# Patient Record
Sex: Male | Born: 1940 | Race: White | Hispanic: No | Marital: Married | Smoking: Never smoker
Health system: Southern US, Community
[De-identification: ages and names within clinical notes are randomized; demographics above are authoritative.]

## PROBLEM LIST (undated history)

## (undated) DIAGNOSIS — K439 Ventral hernia without obstruction or gangrene: Secondary | ICD-10-CM

## (undated) DIAGNOSIS — I10 Essential (primary) hypertension: Secondary | ICD-10-CM

## (undated) DIAGNOSIS — K56609 Unspecified intestinal obstruction, unspecified as to partial versus complete obstruction: Secondary | ICD-10-CM

## (undated) HISTORY — PX: HERNIA REPAIR: SHX51

## (undated) HISTORY — PX: TONSILLECTOMY: SUR1361

## (undated) HISTORY — PX: APPENDECTOMY: SHX54

---

## 2011-07-01 ENCOUNTER — Inpatient Hospital Stay: Payer: Self-pay | Admitting: Surgery

## 2011-07-01 ENCOUNTER — Ambulatory Visit: Payer: Self-pay | Admitting: Family Medicine

## 2011-07-01 LAB — COMPREHENSIVE METABOLIC PANEL
Albumin: 3.5 g/dL (ref 3.4–5.0)
Albumin: 3.5 g/dL (ref 3.4–5.0)
Alkaline Phosphatase: 52 U/L (ref 50–136)
Alkaline Phosphatase: 35 U/L — ABNORMAL LOW (ref 50–136)
Anion Gap: 8 (ref 7–16)
Anion Gap: 12 (ref 7–16)
BUN: 56 mg/dL — ABNORMAL HIGH (ref 7–18)
Calcium, Total: 10 mg/dL (ref 8.5–10.1)
Co2: 37 mmol/L — ABNORMAL HIGH (ref 21–32)
Co2: 32 mmol/L (ref 21–32)
EGFR (African American): 47 — ABNORMAL LOW
Glucose: 131 mg/dL — ABNORMAL HIGH (ref 65–99)
Osmolality: 297 (ref 275–301)
Osmolality: 293 (ref 275–301)
Potassium: 3.6 mmol/L (ref 3.5–5.1)
Potassium: 3.8 mmol/L (ref 3.5–5.1)
SGOT(AST): 38 U/L — ABNORMAL HIGH (ref 15–37)
SGOT(AST): 39 U/L — ABNORMAL HIGH (ref 15–37)
Total Protein: 7.1 g/dL (ref 6.4–8.2)

## 2011-07-01 LAB — CBC
HCT: 54 % — ABNORMAL HIGH (ref 40.0–52.0)
HGB: 18.1 g/dL — ABNORMAL HIGH (ref 13.0–18.0)
MCH: 31.1 pg (ref 26.0–34.0)
MCHC: 33.5 g/dL (ref 32.0–36.0)
MCV: 93 fL (ref 80–100)
RDW: 13.5 % (ref 11.5–14.5)

## 2011-07-01 LAB — CBC WITH DIFFERENTIAL/PLATELET
HCT: 55.8 % — ABNORMAL HIGH (ref 40.0–52.0)
HGB: 18.5 g/dL — ABNORMAL HIGH (ref 13.0–18.0)
MCH: 31.2 pg (ref 26.0–34.0)
MCV: 94 fL (ref 80–100)
Monocyte #: 0.8 x10 3/mm (ref 0.2–1.0)
Monocyte %: 11.4 %
Neutrophil %: 79.2 %
Platelet: 253 10*3/uL (ref 150–440)
RBC: 5.93 10*6/uL — ABNORMAL HIGH (ref 4.40–5.90)
RDW: 13.7 % (ref 11.5–14.5)

## 2011-07-01 LAB — URINALYSIS, COMPLETE
Bilirubin,UR: NEGATIVE
Blood: NEGATIVE
Leukocyte Esterase: NEGATIVE
Ph: 5 (ref 4.5–8.0)
Protein: 30
Squamous Epithelial: NONE SEEN
WBC UR: 4 /HPF (ref 0–5)

## 2011-07-01 LAB — APTT: Activated PTT: 30.3 secs (ref 23.6–35.9)

## 2011-07-01 LAB — PROTIME-INR
INR: 1.1
Prothrombin Time: 14.1 secs (ref 11.5–14.7)

## 2011-07-02 LAB — CBC WITH DIFFERENTIAL/PLATELET
Eosinophil #: 0 10*3/uL (ref 0.0–0.7)
Eosinophil %: 0.7 %
HCT: 47.8 % (ref 40.0–52.0)
HGB: 15.8 g/dL (ref 13.0–18.0)
MCHC: 33.1 g/dL (ref 32.0–36.0)
Monocyte #: 0.7 x10 3/mm (ref 0.2–1.0)
Monocyte %: 12.4 %
Neutrophil #: 4.2 10*3/uL (ref 1.4–6.5)
Neutrophil %: 74.8 %
Platelet: 194 10*3/uL (ref 150–440)
RBC: 5.14 10*6/uL (ref 4.40–5.90)
RDW: 13.7 % (ref 11.5–14.5)
WBC: 5.7 10*3/uL (ref 3.8–10.6)

## 2011-07-02 LAB — BASIC METABOLIC PANEL
Anion Gap: 8 (ref 7–16)
Chloride: 102 mmol/L (ref 98–107)
Creatinine: 1.06 mg/dL (ref 0.60–1.30)
EGFR (Non-African Amer.): >60
Glucose: 133 mg/dL — ABNORMAL HIGH (ref 65–99)

## 2011-07-03 LAB — CBC WITH DIFFERENTIAL/PLATELET
Basophil #: 0 10*3/uL (ref 0.0–0.1)
Basophil %: 0.4 %
Eosinophil %: 3.5 %
Lymphocyte %: 17.6 %
MCH: 31.1 pg (ref 26.0–34.0)
MCHC: 33.1 g/dL (ref 32.0–36.0)
MCV: 94 fL (ref 80–100)
Monocyte #: 0.8 x10 3/mm (ref 0.2–1.0)
Monocyte %: 15 %
Neutrophil %: 63.5 %
Platelet: 169 10*3/uL (ref 150–440)
RBC: 4.58 10*6/uL (ref 4.40–5.90)

## 2011-07-03 LAB — BASIC METABOLIC PANEL
BUN: 22 mg/dL — ABNORMAL HIGH (ref 7–18)
Chloride: 106 mmol/L (ref 98–107)
Creatinine: 0.91 mg/dL (ref 0.60–1.30)
EGFR (Non-African Amer.): >60
Glucose: 107 mg/dL — ABNORMAL HIGH (ref 65–99)
Osmolality: 287 (ref 275–301)
Potassium: 3.7 mmol/L (ref 3.5–5.1)
Sodium: 142 mmol/L (ref 136–145)

## 2014-05-28 NOTE — Consult Note (Signed)
PATIENT NAME:  Bryan Savage, Bryan Savage MR#:  [id number] DATE OF BIRTH:  06/30/1940  DATE OF CONSULTATION:  07/02/2011  REFERRING PHYSICIAN:  Prime Doc / Tiney Rougealph Ely, MD CONSULTING PHYSICIAN:  Kanchan Gal D. Juliann Paresallwood, MD  PRIMARY CARE PHYSICIAN: Clayborn BignessKatherine Bliss, MD  INDICATION: Tachycardia, PVCs, abnormal EKG.   HISTORY OF PRESENT ILLNESS: The patient is a 74 year old male with history of hypertension, gout, and cholelithiasis who presented with abdominal pain, nausea, vomiting, and preop for surgery. He has been having symptoms for several days, got dehydrated and had fluids given but symptoms got progressively worse. He has a history of a ventral and inguinal hernia years ago, but due to recurrent symptoms he was brought for evaluation. Ultrasound suggested cholelithiasis with sludge. Plain films are concerning for obstruction. He was seen in the emergency room for further evaluation. He had distended stomach with significant fluid with diffuse severe small bowel distention. He has had some improvement in his symptoms with bowel rest and NG tube, but may need surgery for cholelithiasis and obstruction.   REVIEW OF SYSTEMS: Denies blackout spells or syncope. He has had nausea and vomiting. No real fever. No chills. No sweats. Denies weight loss, weight gain. No hemoptysis or hematemesis. No bright red blood per rectum.   PAST MEDICAL HISTORY:  1. Hypertension. 2. Gout. 3. Cholelithiasis. 4. Dehydration.   PAST SURGICAL HISTORY:  1. Inguinal and ventral hernia repair.  2. Appendectomy.  3. Tonsillectomy.   ALLERGIES: No known drug allergies.    MEDICATIONS:  1. Promethazine 25 mg every 6 hours p.r.n. 2. Fluticasone nasal spray 50 mcg two sprays twice a day.  3. Colchicine 0.6 mg a day.  4. Atenolol 25 mg daily.  5. Aspirin 81 mg a day.  6. Multivitamin once a day.   FAMILY HISTORY: Arthritis, emphysema, and chronic obstructive pulmonary disease.   SOCIAL HISTORY: He lives in [place] with his  wife. He operates a [employer]. No smoking. No alcohol consumption.   PHYSICAL EXAMINATION:   VITAL SIGNS: Blood pressure 150/90, pulse 75, and respiratory rate 18.   HEENT: Normocephalic, atraumatic. Pupils equal and reactive to light.   NECK: Supple. No jugular venous distention, bruits, or adenopathy.   LUNGS: Clear to auscultation and percussion. No significant wheezing, rhonchi, or rales.   HEART: Regular rate and rhythm. Systolic ejection murmur at left sternal border.   ABDOMEN: Distended, positive bowel sounds, mild diffuse discomfort and tenderness. No rebound or guarding.   EXTREMITIES: Examination is within normal limits.   NEUROLOGIC: Examination is intact.   SKIN: Examination is normal.   LABS/STUDIES: Urinalysis was basically unremarkable.   White count 5.9, hemoglobin 18 hematocrit 54, and platelet count 235. BUN 56, creatinine 1.19, sodium 138, potassium 3.8, chloride 94, CO2 32. LFTs unremarkable.   Plain films, 3-way of the abdomen, with increased density in the left base compatible with atelectasis versus fibrosis with possible multiple air-fluid levels consistent with possible obstruction, gastroenteritis, enteritis.  Ultrasound of the abdomen suggests cholelithiasis with sludge.   EKG: Normal sinus rhythm, nonspecific ST-T wave changes, PVCs.   ASSESSMENT:  1. Preop for possible bowel obstruction.  2. Cholelithiasis.  3. Abnormal EKG with PVCs. 4. Dehydration. 5. Mild hypoxemia.  6. Gastroesophageal reflux disease. 7. History of gout.   PLAN: Agree with continue current medications. Agree with bowel rest. Agree with hydration. Agree with NG tube. Telemetry will be helpful. We will probably treat the patient medically. Echocardiogram will be helpful, but probably will not proceed with invasive  therapy and treat the patient conservatively for now. He has no cardiac history. I suspect medical therapy will be the way to go with conservative  therapy cardiac-wise until surgery is done.  ____________________________ Bobbie Stack. Juliann Pares, MD ddc:slb D: 07/03/2011 09:45:00 ET T: 07/03/2011 12:44:31 ET JOB#: [id number]  cc: Adelena Desantiago D. Juliann Pares, MD, <Dictator> Alwyn Pea MD ELECTRONICALLY SIGNED 07/15/2011 8:18

## 2014-05-28 NOTE — Discharge Summary (Signed)
PATIENT NAME:  Bryan Savage, Bryan Savage MR#:  [id number] DATE OF BIRTH:  03/31/1940  DATE OF ADMISSION:  07/01/2011 DATE OF DISCHARGE:  07/04/2011  BRIEF HISTORY/HOSPITAL COURSE: Bryan Savage is a 74 year old gentleman admitted through the Emergency Room with a large abdominal wall hernia and possible bowel obstruction. He has had multiple episodes of nausea and vomiting over the last several days prior to admission and presented to the Emergency Room with a massive abdominal wall hernia, CT evidence of possible small bowel obstruction. He has had a hernia for a number of years after a previous hernia repair. Recurrence created significant loss of domain in the abdomen with a good portion of his intestinal contents in the hernia sac. He was placed on nasogastric decompression, and his symptoms resolved almost immediately. His abdomen decompressed by the 30th, and we were able to discontinue his nasogastric tube and place him on liquid diet. He improved over the next 24 hours and was discharged home on the 31st to be followed in the office in 7 to 10 days' time. He will require an extensive hernia repair, and we have discussed [facility].   DISCHARGE MEDICATIONS: Vicodin for pain.   ____________________________ Carmie Endalph L. Ely III, MD rle:cbb D: 07/13/2011 07:17:14 ET T: 07/14/2011 13:28:10 ET JOB#: [id number]  cc: Quentin Orealph L. Ely III, MD, <Dictator> Quentin OreALPH L ELY MD ELECTRONICALLY SIGNED 07/15/2011 8:38

## 2014-05-28 NOTE — H&P (Signed)
PATIENT NAME:  Bryan Savage, Cashel O MR#:  [id number] DATE OF BIRTH:  November 09, 1940  DATE OF ADMISSION:  07/01/2011  PRIMARY CARE PHYSICIAN:  Dr. Quillian QuinceBliss ADMITTING PHYSICIAN:  Dr. Michela PitcherEly  CHIEF COMPLAINT: Abdominal pain, nausea, and vomiting.   BRIEF HISTORY: Bryan Savage is a 74 year old gentleman seen in the Emergency Room with a several-day history of abdominal pain. He began to have pronounced abdominal pain complicated by marked nausea and vomiting over the past 5 to 6 days. He was evaluated by his primary care doctor yesterday and diagnosed with a probable gastrointestinal virus. He was given 3 liters of IV fluid as he was mildly dehydrated and asked returned today for further evaluation. Today he was unimproved and there was concern about ongoing nausea. He was referred to the Urgent Care Center. Ultrasound was performed which revealed multiple gallstones without evidence of any gallbladder wall thickening. However, he was noted to have a massive abdominal wall hernia and was referred to the Emergency Room for further evaluation. CT scan was performed which revealed a partial small bowel obstruction likely resulting from a large segment of bowel in the hernia sac. The sac appeared to be massive with average size abdominal wall defect, but a large portion of his intestinal tract appeared to be in the sac. He reports having had a ventral hernia repair 25 years ago performed in [place]. He has not noted any problems recently but did develop a recurrent hernia several years ago. He had his last bowel movement five days ago. He has not been passing gas. He has not had a previous colonoscopy. He has had an appendectomy and two previous inguinal hernia repairs in addition to his ventral hernia repair. He denies any history of hepatitis, yellow jaundice, pancreatitis, peptic ulcer disease, gallbladder disease, or diverticulitis.   He has no cardiac history by note.  He does have a history of hypertension and is currently  on medication which he does not recall at the present time. He has no diabetes. He has no history of stroke or any cardiac symptoms, either dysrhythmia or pain. 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 He does not smoke cigarettes and is not a reformed smoker. He does not drink any alcohol. He lives with his wife.   REVIEW OF SYSTEMS: 10-point review of systems is unremarkable with the exception of some urinary symptoms from benign prostatic hypertrophy.   PHYSICAL EXAMINATION:  GENERAL: He is an alert although somewhat confused gentleman with conflicting answers to similar questions. His blood pressure is 140/88, heart rate is 94 and regular, oxygen saturations 95% on room air with a temperature of 97.6.   HEENT: Ruddy complexion, but no scleral icterus or pupillary abnormalities. There is no facial deformity.   NECK: Supple without adenopathy. Trachea is midline.   CHEST: Clear with no adventitious sounds or congestion. He appears to have normal pulmonary excursion.   CARDIAC: No gallop rhythms to my ear, but does appear to be in irregular cardiac rhythm. I am not sure whether he has simply multiple prematurities or he is actually in atrial fibrillation. EKG is pending.   ABDOMEN: His abdominal exam reveals a massive recurrent midline hernia with sac extending to the right lower quadrant. There are a visible loops of bowel through the abdominal wall. He does not have any significant abdominal tenderness. He has no rebound or guarding. He does have some hyperactive bowel sounds but no tinkling sounds are noted. He has well-healed groin scars. I do not feel  a groin hernia.   EXTREMITIES: Lower extremity exam was full range of motion, no deformities, and good distal pulses.   PSYCHIATRIC: Normal orientation although, again, he is a bit confused with current presentation. He has a normal affect.   IMPRESSION AND PLAN:  I have independently reviewed his laboratory values and his CT scan.  He does have a massive subcutaneous hernia sac with a moderate-size defect. There does appear to be a transition point in the sac itself as opposed to at the neck of the hernia. This finding would suggest that there are some adhesions responsible for his partial small bowel obstruction. We will plan to admit him to the hospital, begin nasogastric decompression, obtain an internal medicine risk assessment, and follow him over the next several days. He is likely going to need to consider elective repair. Because of  the massive amount of bowel outside his abdominal cavity he probably has lost the right of bowel domain at this point and will need to have some sort of abdominal wall closure assistance. I would anticipate he would either need some part of component separation or better yet staged abdominal wall closure with a Whitman patch. We will continue to evaluate him while he is in the hospital and help make a decision regarding the most appropriate intervention.     ____________________________ Quentin Ore III, MD rle:bjt D: 07/01/2011 22:15:28 ET T: 07/02/2011 09:42:35 ET JOB#: [id number]  cc: Carmie End, MD, <Dictator> Burley Saver, MD Quentin Ore MD ELECTRONICALLY SIGNED 07/02/2011 19:15

## 2014-05-28 NOTE — Consult Note (Signed)
PATIENT NAME:  Bryan Savage, Bryan Savage MR#:  [id number] DATE OF BIRTH:  05-Dec-1940  DATE OF CONSULTATION:  07/02/2011  REFERRING PHYSICIAN:  Dr. Michela Savage  CONSULTING PHYSICIAN:  Bryan Vanderloop, MD  PRIMARY CARE PHYSICIAN: Bryan Bigness, MD   REASON FOR CONSULTATION: Preop evaluation.   HISTORY OF PRESENT ILLNESS: Bryan Savage is a pleasant 74 year old gentleman with history of hypertension, gout, and cholelithiasis who presented to the hospital with complaints of abdominal pain, nausea, and vomiting. The patient reports that symptoms began approximately five days ago, began with abdominal pressure and then increased pain and swelling. By the following day he developed nausea and vomiting. Denies any hematemesis or bloody stool. Reports his last bowel movement was four days ago. The patient denies any history of similar symptoms. He reports a history of ventral and inguinal hernia repair one 40 years ago and another 35 years ago. The patient presented to his primary physician on 06/30/2011, was given IV fluids. The patient was given medication for symptom control but symptoms continued to progress. He represented and was sent to Urgent Care at which point an ultrasound was obtained along with three-view abdominal film. Ultrasound was revealing for cholelithiasis and gallbladder sludge. Plain film was concerning for possible obstruction. The patient was sent to the ED for further evaluation. CT scan of the abdomen was revealing for probable bilateral renal cysts. There was moderately to severely distended stomach with fluid. There was diffuse severe small bowel distention. There were two ventral hernias. The larger is a broad-based hernia at the umbilicus which contains a significant amount of mesentery and small and large bowel. They are both dilated and collapsed loops of small bowel in the hernia sac. Defined transition point not identified. There is a left paramedian ventral hernia above this hernia which  contains fat. Colonic diverticulosis is present. The patient denies any chest pain or shortness of breath. Reports that his symptoms have improved since admission. He is pretty active at baseline. 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   PAST MEDICAL HISTORY:  1. Hypertension.  2. Gout.  3. Cholelithiasis.    PAST SURGICAL HISTORY:  1. Inguinal and ventral hernia repair. One was 40 years ago and another one was 35 years ago.  2. Appendectomy.  3. Tonsillectomy.   ALLERGIES: No known drug allergies.   MEDICATIONS:  1. Promethazine 25 mg every 6 to 8 hours.  2. Fluticasone nasal spray 50 mcg two sprays nasal b.i.d.  3. Colchicine 0.6 mg as needed.  4. Atenolol 25 mg daily.  5. Aspirin 81 mg daily.  6. Multivitamin daily.   FAMILY HISTORY: Sister with arthritis. Mother with emphysema.   SOCIAL HISTORY: He lives in [place] with his wife. Denies any tobacco, alcohol, or drug use. They own a service station.   REVIEW OF SYSTEMS: CONSTITUTIONAL: No fevers. He endorsed nausea and vomiting as per history of present illness. EYES: No glaucoma or cataracts. ENT: No epistaxis, discharge, or dysphagia. RESPIRATORY: No cough, wheezing, shortness of breath, or hemoptysis. CARDIOVASCULAR: No chest pain, orthopnea, edema, palpitations, or syncope. GI: As per history of present illness. GU: No dysuria or hematuria. ENDOCRINE: No polyuria or polydipsia. HEME: No easy bleeding. SKIN: No ulcers. MUSCULOSKELETAL: No joint pain or swelling. NEUROLOGIC: No history of strokes or seizure. MUSCULOSKELETAL: No neck pain or joint pain. History of gout. PSYCH: He denies any suicidal ideation.   PHYSICAL EXAMINATION:   VITAL SIGNS: Temperature 98.2, pulse 76, respiratory rate 18, blood pressure 155/93, sating at  91% on room air.   GENERAL: Lying in bed in no apparent distress.   HEENT: Normocephalic, atraumatic. Pupils equal, symmetric, nonicteric NG tube in place. He has slightly dry mucous  membrane.   NECK: Soft and supple without adenopathy or JVP.   CARDIOVASCULAR: Non-tachy with ectopic beats. No murmurs, rubs, or gallops.   LUNGS: Faint basilar crackles. No use of accessory muscles or increased respiratory effort.   ABDOMEN: Soft. Minimal tenderness. He has a large hernia mainly to the right side. He has hypoactive bowel sounds.   EXTREMITIES: No edema. Dorsal pedis pulses intact.   MUSCULOSKELETAL: No joint effusion.   SKIN: No ulcers.   NEUROLOGIC: No dysarthria or aphasia. Symmetrical strength. No focal deficits.   PSYCH: He is alert and oriented. The patient is cooperative.   PERTINENT LABS AND STUDIES: Urinalysis with specific gravity of 1.025, pH 5, protein 30 mg/dL, RBC 2 per high-power field, WBC 4 per high-power field. WBC 5.9, hemoglobin 18.1, hematocrit 54, platelets 235, MCV 93, glucose 131, BUN 56, creatinine 1.19, sodium 138, potassium 3.8, chloride 94, carbon dioxide 32, calcium 10, total bilirubin 1.1, alkaline phosphatase 35, ALT 35, AST 39. INR 1.1. PTT 30.3.  Three-way abdomen with increased density at the left base compatible with atelectasis or fibrosis. There is possible soft tissue hernia extending laterally from the right pelvis. There are multiple nonspecific fluid levels and nondilated loops of bowel. Such fluid levels usually are associated with gastroenteritis, enteritis, or intra-abdominal inflammation. The patient has soft tissue hernia on the right. The possibility of low-grade partial obstruction cannot be totally excluded. CT evaluation was recommended. Also noted cholelithiasis.   Ultrasound of the abdomen with cholelithiasis. No associated thickening of the gallbladder wall. Sludge is present in the gallbladder. Pancreas is obscured by bowel gas. CT scan results as dictated above.   ASSESSMENT AND PLAN: Bryan Savage is a pleasant 74 year old gentleman with history of hypertension, gout, and cholelithiasis presenting with nausea, vomiting,  abdominal pain. 1. Preop evaluation. The patient falls into a low risk category for surgery. Discussed with patient and wife and they wish to pursue surgery if indicated. Will hold aspirin. Continue atenolol. On cardiac exam noted to have ectopic beats. Will place on off-unit tele and get EKG.  2. Dehydration as supported by renal insufficiency, hypercalcemia, hemoconcentration, elevated BUN which has improved posthydration.  3. Bowel obstruction. Management as per Surgery.  4. Mild hypoxia likely in the setting of atelectasis as noted on x-ray. Continue oxygen as needed and start on incentive spirometry.  5. Prophylaxis with Protonix, TEDs, and SCDs.   TIME SPENT: Approximately 50 minutes on patient care.   ____________________________ Reuel DerbyAlounthith Jamilyn Pigeon, MD ap:drc D: 07/02/2011 04:18:00 ET T: 07/02/2011 11:02:06 ET JOB#: [id number]  cc: Pearlean BrownieAlounthith Abdo Denault, MD, <Dictator> Reuel DerbyALOUNTHITH Jamaree Hosier MD ELECTRONICALLY SIGNED 07/10/2011 22:33

## 2015-08-07 ENCOUNTER — Emergency Department: Payer: Medicare HMO

## 2015-08-07 ENCOUNTER — Inpatient Hospital Stay
Admission: EM | Admit: 2015-08-07 | Discharge: 2015-08-09 | DRG: 394 | Disposition: A | Discharge status: Home / Self Care | Payer: Medicare HMO | Attending: Surgery | Admitting: Surgery

## 2015-08-07 ENCOUNTER — Encounter: Payer: Self-pay | Admitting: *Deleted

## 2015-08-07 DIAGNOSIS — K432 Incisional hernia without obstruction or gangrene: Principal | ICD-10-CM | POA: Diagnosis present

## 2015-08-07 DIAGNOSIS — K565 Intestinal adhesions [bands], unspecified as to partial versus complete obstruction: Secondary | ICD-10-CM

## 2015-08-07 DIAGNOSIS — K5669 Other intestinal obstruction: Secondary | ICD-10-CM | POA: Diagnosis not present

## 2015-08-07 DIAGNOSIS — Z9049 Acquired absence of other specified parts of digestive tract: Secondary | ICD-10-CM

## 2015-08-07 DIAGNOSIS — K5652 Intestinal adhesions [bands] with complete obstruction: Secondary | ICD-10-CM | POA: Diagnosis present

## 2015-08-07 DIAGNOSIS — Z8249 Family history of ischemic heart disease and other diseases of the circulatory system: Secondary | ICD-10-CM

## 2015-08-07 DIAGNOSIS — K56609 Unspecified intestinal obstruction, unspecified as to partial versus complete obstruction: Secondary | ICD-10-CM | POA: Insufficient documentation

## 2015-08-07 DIAGNOSIS — I10 Essential (primary) hypertension: Secondary | ICD-10-CM | POA: Diagnosis present

## 2015-08-07 DIAGNOSIS — E860 Dehydration: Secondary | ICD-10-CM | POA: Diagnosis present

## 2015-08-07 HISTORY — DX: Essential (primary) hypertension: I10

## 2015-08-07 HISTORY — DX: Unspecified intestinal obstruction, unspecified as to partial versus complete obstruction: K56.609

## 2015-08-07 LAB — CBC WITH DIFFERENTIAL/PLATELET
BASOS PCT: 0 %
Basophils Absolute: 0 10*3/uL (ref 0–0.1)
Eosinophils Absolute: 0 10*3/uL (ref 0–0.7)
Eosinophils Relative: 0 %
HEMATOCRIT: 56.5 % — AB (ref 40.0–52.0)
HEMOGLOBIN: 19.4 g/dL — AB (ref 13.0–18.0)
LYMPHS ABS: 0.9 10*3/uL — AB (ref 1.0–3.6)
Lymphocytes Relative: 10 %
MCH: 31.9 pg (ref 26.0–34.0)
MCHC: 34.4 g/dL (ref 32.0–36.0)
MCV: 92.8 fL (ref 80.0–100.0)
MONOS PCT: 8 %
Monocytes Absolute: 0.7 10*3/uL (ref 0.2–1.0)
NEUTROS ABS: 6.9 10*3/uL — AB (ref 1.4–6.5)
NEUTROS PCT: 82 %
Platelets: 196 10*3/uL (ref 150–440)
RBC: 6.09 MIL/uL — ABNORMAL HIGH (ref 4.40–5.90)
RDW: 13.5 % (ref 11.5–14.5)
WBC: 8.5 10*3/uL (ref 3.8–10.6)

## 2015-08-07 LAB — COMPREHENSIVE METABOLIC PANEL
ALK PHOS: 29 U/L — AB (ref 38–126)
ALT: 32 U/L (ref 17–63)
AST: 38 U/L (ref 15–41)
Albumin: 4.8 g/dL (ref 3.5–5.0)
Anion gap: 14 (ref 5–15)
BUN: 31 mg/dL — AB (ref 6–20)
CALCIUM: 11.3 mg/dL — AB (ref 8.9–10.3)
CO2: 32 mmol/L (ref 22–32)
CREATININE: 1.18 mg/dL (ref 0.61–1.24)
Chloride: 93 mmol/L — ABNORMAL LOW (ref 101–111)
GFR calc non Af Amer: 59 mL/min — ABNORMAL LOW (ref >60)
Glucose, Bld: 125 mg/dL — ABNORMAL HIGH (ref 65–99)
Potassium: 3.8 mmol/L (ref 3.5–5.1)
SODIUM: 139 mmol/L (ref 135–145)
Total Bilirubin: 1.9 mg/dL — ABNORMAL HIGH (ref 0.3–1.2)
Total Protein: 8.3 g/dL — ABNORMAL HIGH (ref 6.5–8.1)

## 2015-08-07 LAB — LIPASE, BLOOD: Lipase: 26 U/L (ref 11–51)

## 2015-08-07 LAB — MAGNESIUM: MAGNESIUM: 2.2 mg/dL (ref 1.7–2.4)

## 2015-08-07 LAB — LACTIC ACID, PLASMA: Lactic Acid, Venous: 2 mmol/L (ref 0.5–1.9)

## 2015-08-07 MED ORDER — DIATRIZOATE MEGLUMINE & SODIUM 66-10 % PO SOLN
15.0000 mL | Freq: Once | ORAL | Status: AC
  Administered 2015-08-07: 15 mL via ORAL

## 2015-08-07 MED ORDER — ONDANSETRON HCL 4 MG/2ML IJ SOLN
4.0000 mg | INTRAMUSCULAR | Status: AC
  Administered 2015-08-07: 4 mg via INTRAVENOUS
  Filled 2015-08-07: qty 2

## 2015-08-07 MED ORDER — SODIUM CHLORIDE 0.9 % IV BOLUS (SEPSIS)
500.0000 mL | INTRAVENOUS | Status: AC
  Administered 2015-08-07: 500 mL via INTRAVENOUS

## 2015-08-07 MED ORDER — MORPHINE SULFATE (PF) 4 MG/ML IV SOLN
4.0000 mg | Freq: Once | INTRAVENOUS | Status: AC
  Administered 2015-08-07: 4 mg via INTRAVENOUS
  Filled 2015-08-07: qty 1

## 2015-08-07 MED ORDER — IOPAMIDOL (ISOVUE-300) INJECTION 61%
100.0000 mL | Freq: Once | INTRAVENOUS | Status: AC | PRN
  Administered 2015-08-07: 100 mL via INTRAVENOUS

## 2015-08-07 NOTE — ED Provider Notes (Signed)
[facility] Emergency Department Provider Note  ____________________________________________  Time seen: Approximately 5:28 PM  I have reviewed the triage vital signs and the nursing notes.   HISTORY  Chief Complaint Constipation    HPI Bryan Savage is a 75 y.o. male with hx of ventral hernia repair many years ago and bowel obstruction about 4 years ago managed non-operatively presents with gradual onset worsening nausea, constipation, abdominal distention, and abdominal discomfort for about 2 days.  He states he normally is very regular with 2 bowel movements per day but has not had any bowel movement for 2 days.  His abdomen feels increasingly full and uncomfortable which he describes as a very mild ill ache.  He has had slightly decreased appetite.  He has been persistently nauseated which waxes and wanes from mild to severe but has not yet vomited.  He does occasionally pass gas.  He denies chest pain, shortness of breath, fever/chills, dysuria.   Past Medical History  Diagnosis Date  . Hypertension   . Bowel obstruction (HCC)     There are no active problems to display for this patient.   History reviewed. No pertinent past surgical history.  No current outpatient prescriptions on file.  Allergies Review of patient's allergies indicates no known allergies.  History reviewed. No pertinent family history.  Social History Social History  Substance Use Topics  . Smoking status: Never Smoker   . Smokeless tobacco: None  . Alcohol Use: None    Review of Systems Constitutional: No fever/chills Eyes: No visual changes. ENT: No sore throat. Cardiovascular: Denies chest pain. Respiratory: Denies shortness of breath. Gastrointestinal: Constipation, nausea, occasionally passing gas, no vomiting, distention Genitourinary: Negative for dysuria. Musculoskeletal: Negative for back pain. Skin: Negative for rash. Neurological: Negative for  headaches, focal weakness or numbness.  10-point ROS otherwise negative.  ____________________________________________   PHYSICAL EXAM:  VITAL SIGNS: ED Triage Vitals  Enc Vitals Group     BP 08/07/15 1621 148/108 mmHg     Pulse Rate 08/07/15 1621 79     Resp 08/07/15 1621 18     Temp 08/07/15 1621 97.6 F (36.4 C)     Temp Source 08/07/15 1621 Oral     SpO2 08/07/15 1621 90 %     Weight 08/07/15 1621 170 lb (77.111 kg)     Height 08/07/15 1621 5\' 7"  (1.702 m)     Head Cir --      Peak Flow --      Pain Score --      Pain Loc --      Pain Edu? --      Excl. in GC? --     Constitutional: Alert and oriented. Well appearing and in no acute distress. Eyes: Conjunctivae are normal. PERRL. EOMI. Head: Atraumatic. Nose: No congestion/rhinnorhea. Mouth/Throat: Mucous membranes are moist.  Oropharynx non-erythematous. Neck: No stridor.  No meningeal signs.   Cardiovascular: Normal rate, regular rhythm. Good peripheral circulation. Grossly normal heart sounds.   Respiratory: Normal respiratory effort.  No retractions. Lungs CTAB. Gastrointestinal: The patient has a protuberant abdomen which she says is normal although he claims it is more distended than usual.  He has an old visual hernia scar in his abdomen is soft and the protuberance (likely a severe hernia) is nontender and easily reducible.  Decreased bowel sounds throughout.  No tenderness to palpation throughout. Musculoskeletal: No lower extremity tenderness nor edema. No gross deformities of extremities. Neurologic:  Normal speech and language. No  gross focal neurologic deficits are appreciated.  Skin:  Skin is warm, dry and intact. No rash noted. Psychiatric: Mood and affect are normal. Speech and behavior are normal.  ____________________________________________   LABS (all labs ordered are listed, but only abnormal results are displayed)  Labs Reviewed  CBC WITH DIFFERENTIAL/PLATELET - Abnormal; Notable for the  following:    RBC 6.09 (*)    Hemoglobin 19.4 (*)    HCT 56.5 (*)    Neutro Abs 6.9 (*)    Lymphs Abs 0.9 (*)    All other components within normal limits  LACTIC ACID, PLASMA - Abnormal; Notable for the following:    Lactic Acid, Venous 2.0 (*)    All other components within normal limits  COMPREHENSIVE METABOLIC PANEL - Abnormal; Notable for the following:    Chloride 93 (*)    Glucose, Bld 125 (*)    BUN 31 (*)    Calcium 11.3 (*)    Total Protein 8.3 (*)    Alkaline Phosphatase 29 (*)    Total Bilirubin 1.9 (*)    GFR calc non Af Amer 59 (*)    All other components within normal limits  LIPASE, BLOOD  MAGNESIUM  LACTIC ACID, PLASMA   ____________________________________________  EKG  ED ECG REPORT I, Elita Dame, the attending physician, personally viewed and interpreted this ECG.   Date: 08/07/2015  EKG Time: 18:10  Rate: 101  Rhythm: atrial fibrillation, rate 101  Axis: Left axis deviation  Intervals:left anterior fascicular block  ST&T Change: Non-specific ST segment / T-wave changes, but no evidence of acute ischemia.   ____________________________________________  RADIOLOGY   Ct Abdomen Pelvis W Contrast  08/07/2015  CLINICAL DATA:  Extensive aortic atherosclerosis. EXAM: CT ABDOMEN AND PELVIS WITH CONTRAST TECHNIQUE: Multidetector CT imaging of the abdomen and pelvis was performed using the standard protocol following bolus administration of intravenous contrast. CONTRAST:  ISOVUE-300 IOPAMIDOL (ISOVUE-300) INJECTION 61% COMPARISON:  Jul 01, 2011 CT abdomen and pelvis; abdomen series August 07, 2015 FINDINGS: Lower chest: There is scarring in the anterior and lateral left base regions. Lung bases otherwise are clear. There are foci of coronary artery calcification present. There is dilatation of the distal esophagus, likely due to the bowel obstruction more distally. Hepatobiliary: There are multiple cysts scattered throughout the liver, stable. The largest  cyst is in the anterior segment of the right lobe of the liver near the dome measuring 2.2 x 2.0 cm. No noncystic liver masses are apparent. Gallbladder is mildly distended with several gallstones within the gallbladder. The gallbladder wall is not appreciably thickened. There is no biliary duct dilatation. Pancreas: Pancreas is somewhat atrophic. There is no pancreatic mass or inflammatory focus. Spleen: No splenic lesions are evident. Adrenals/Urinary Tract: Adrenals appear stable compared to prior study. There is slight left adrenal hypertrophy, stable. Right adrenal appears normal. There are several cysts in the left kidney. The largest cyst is in the periphery of the upper pole of the left kidney laterally measuring 3.0 x 2.5 cm. There are tiny cysts in the right kidney. There is no hydronephrosis on either side. There is no renal or ureteral calculus on either side. Urinary bladder is midline with wall thickness within normal limits. Stomach/Bowel: There is diffuse dilatation of the stomach and small bowel to the level of a transition zone in the mid to distal ileum. This transition zone is best appreciated on sagittal slice 124 series 6, located just proximal to bowel extending into a a sizable ventral  hernia. There are multiple loops of small and large bowel within this large hernia. There is no appreciable bowel wall or mesenteric thickening. No free air or portal venous air is evident. There is no bowel pneumatosis. There are multiple sigmoid diverticula but no diverticulitis evident. Vascular/Lymphatic: There is atherosclerotic calcification in the aorta. The aorta is somewhat ectatic. There is no abdominal aortic aneurysm. There is also calcification in common and internal iliac arteries bilaterally. Major mesenteric vessels appear patent. There is no adenopathy in the abdomen or pelvis. Reproductive: The prostate is prominent with multiple prostatic calculi. Seminal vesicles appear unremarkable. No  pelvic mass or pelvic fluid collection is evident. Other: The appendix resides within the ventral hernia. There is no periappendiceal region inflammation. The ventral hernia measures 10.8 cm from right to left and 4.8 cm from superior to inferior. As noted above, multiple loops of small and large bowel extending into this hernia, more toward the right lateral abdomen compared to the left. There is no ascites or abscess in the abdomen or pelvis. Musculoskeletal: There is degenerative change in the lumbar spine. 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 There are no blastic or lytic bone lesions. No intramuscular lesions are evident. IMPRESSION: Small bowel obstruction with transition zone in the mid to distal ileum. The transition zone is located near the inferior aspect of a large ventral hernia. 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 The cecum and appendix are located within the rightward aspect of this large ventral hernia. There are loops of dilated jejunum as well as nondilated distal ileum within this hernia. Sigmoid diverticulosis without diverticulitis. Gallbladder mildly distended with cholelithiasis. Gallbladder wall not thickened. Prostate is prominent contains multiple calculi. Advise correlation with PSA. No renal or ureteral calculi.  No hydronephrosis. Aortic atherosclerosis. Multiple foci of coronary artery calcification. No bowel pneumatosis.  No abscess.  No ascites. Electronically Signed   By: Bretta BangWilliam  Woodruff III M.D.   On: 08/07/2015 20:07   Dg Abd Acute W/chest  08/07/2015  CLINICAL DATA:  Nausea, vomiting and abdominal distension since yesterday. Previous right-sided abdominal wall hernia repair approximately 20 years ago. EXAM: DG ABDOMEN ACUTE W/ 1V CHEST COMPARISON:  Abdomen plain film dated 07/02/2011. FINDINGS: Single view of the chest: Heart size is normal. There is age-related aortic ectasia. Atherosclerotic changes noted at the aortic arch. There is slight  elevation of the left hemidiaphragm with overlying atelectasis and/or scarring. Lungs otherwise clear. Osseous structures about the chest are unremarkable. Supine and upright views of the abdomen: There are moderately distended gas-filled loops of bowel within the central abdomen and right abdomen, most likely small bowel, with associated air-fluid levels. Suspect right-sided abdominal wall hernia. No evidence of free intraperitoneal air appreciated. IMPRESSION: 1. Probable small bowel obstruction, possibly related to recurrent right abdominal wall hernia. Recommend abdomen/pelvis CT with oral and IV contrast for further characterization. 2. No evidence of acute cardiopulmonary abnormality. 3. Aortic atherosclerosis. Electronically Signed   By: Bary RichardStan  Maynard M.D.   On: 08/07/2015 18:09    ____________________________________________   PROCEDURES  Procedure(s) performed:   Procedures   ____________________________________________   INITIAL IMPRESSION / ASSESSMENT AND PLAN / ED COURSE  Pertinent labs & imaging results that were available during my care of the patient were reviewed by me and considered in my medical decision making (see chart for details).  The patient is borderline hypoxemic at triage and then had an oxygen saturation of 89% on room air.  He denies having any lung disease states  he is not feel short of breath.  His lungs are clear.  I will check an acute abdomen series to rule out acute surgical emergencies such as volvulus as well as to check a chest x-ray.  I anticipate moving forward with a CT scan of his abdomen.  He does not require pain medicine at this time.   ----------------------------------------- 8:35 PM on 08/07/2015 -----------------------------------------  The patient is now vomiting after his CT scan and is requesting pain and nausea medicine.  His CT scan is consistent with small bowel obstruction in the ileum.  The nurses will place an NG tube.  I  discussed the case by phone with Dr. Orvis BrillLoflin who evaluated the patient in person in the emergency department and admit for further management of his SBO. ____________________________________________  FINAL CLINICAL IMPRESSION(S) / ED DIAGNOSES  Final diagnoses:  Small bowel obstruction (HCC)     MEDICATIONS GIVEN DURING THIS VISIT:  Medications  sodium chloride 0.9 % bolus 500 mL (not administered)  morphine 4 MG/ML injection 4 mg (not administered)  ondansetron (ZOFRAN) injection 4 mg (not administered)  ondansetron (ZOFRAN) injection 4 mg (4 mg Intravenous Given 08/07/15 1824)  diatrizoate meglumine-sodium (GASTROGRAFIN) 66-10 % solution 15 mL (15 mLs Oral Given 08/07/15 1851)  iopamidol (ISOVUE-300) 61 % injection 100 mL (100 mLs Intravenous Contrast Given 08/07/15 1938)     NEW OUTPATIENT MEDICATIONS STARTED DURING THIS VISIT:  New Prescriptions   No medications on file      Note:  This document was prepared using Dragon voice recognition software and may include unintentional dictation errors.   Loleta Roseory Naida Escalante, MD 08/07/15 2035

## 2015-08-07 NOTE — ED Notes (Signed)
Pt saturation 89% on room air, placed on 2 L via nasal canula saturation increased to 94%

## 2015-08-07 NOTE — ED Notes (Signed)
States he has not had a BM in 2 days, states he is passing gas, hx of bowel obstruction, states abd distention and mild nausea

## 2015-08-07 NOTE — ED Notes (Signed)
Dr. Loflin at bedside.  

## 2015-08-08 ENCOUNTER — Encounter: Payer: Self-pay | Admitting: Surgery

## 2015-08-08 ENCOUNTER — Inpatient Hospital Stay: Payer: Medicare HMO

## 2015-08-08 DIAGNOSIS — K5652 Intestinal adhesions [bands] with complete obstruction: Secondary | ICD-10-CM | POA: Diagnosis present

## 2015-08-08 DIAGNOSIS — I10 Essential (primary) hypertension: Secondary | ICD-10-CM | POA: Insufficient documentation

## 2015-08-08 DIAGNOSIS — E860 Dehydration: Secondary | ICD-10-CM | POA: Diagnosis present

## 2015-08-08 DIAGNOSIS — Z9049 Acquired absence of other specified parts of digestive tract: Secondary | ICD-10-CM | POA: Diagnosis not present

## 2015-08-08 DIAGNOSIS — K432 Incisional hernia without obstruction or gangrene: Principal | ICD-10-CM

## 2015-08-08 DIAGNOSIS — K565 Intestinal adhesions [bands] with obstruction (postprocedural) (postinfection): Secondary | ICD-10-CM

## 2015-08-08 DIAGNOSIS — K5669 Other intestinal obstruction: Secondary | ICD-10-CM | POA: Diagnosis not present

## 2015-08-08 DIAGNOSIS — Z8249 Family history of ischemic heart disease and other diseases of the circulatory system: Secondary | ICD-10-CM | POA: Diagnosis not present

## 2015-08-08 LAB — BASIC METABOLIC PANEL
Anion gap: 8 (ref 5–15)
BUN: 30 mg/dL — AB (ref 6–20)
CO2: 41 mmol/L — ABNORMAL HIGH (ref 22–32)
Calcium: 10.1 mg/dL (ref 8.9–10.3)
Chloride: 92 mmol/L — ABNORMAL LOW (ref 101–111)
Creatinine, Ser: 1.46 mg/dL — ABNORMAL HIGH (ref 0.61–1.24)
GFR calc Af Amer: 52 mL/min — ABNORMAL LOW (ref >60)
GFR, EST NON AFRICAN AMERICAN: 45 mL/min — AB (ref >60)
Glucose, Bld: 160 mg/dL — ABNORMAL HIGH (ref 65–99)
POTASSIUM: 3.6 mmol/L (ref 3.5–5.1)
SODIUM: 141 mmol/L (ref 135–145)

## 2015-08-08 LAB — CBC
HEMATOCRIT: 52.8 % — AB (ref 40.0–52.0)
HEMOGLOBIN: 18.2 g/dL — AB (ref 13.0–18.0)
MCH: 32 pg (ref 26.0–34.0)
MCHC: 34.4 g/dL (ref 32.0–36.0)
MCV: 92.9 fL (ref 80.0–100.0)
Platelets: 157 10*3/uL (ref 150–440)
RBC: 5.68 MIL/uL (ref 4.40–5.90)
RDW: 13.9 % (ref 11.5–14.5)
WBC: 7.7 10*3/uL (ref 3.8–10.6)

## 2015-08-08 MED ORDER — DEXTROSE IN LACTATED RINGERS 5 % IV SOLN
INTRAVENOUS | Status: DC
  Administered 2015-08-08 – 2015-08-09 (×4): via INTRAVENOUS

## 2015-08-08 MED ORDER — FAMOTIDINE IN NACL 20-0.9 MG/50ML-% IV SOLN
20.0000 mg | Freq: Two times a day (BID) | INTRAVENOUS | Status: DC
  Administered 2015-08-08 – 2015-08-09 (×4): 20 mg via INTRAVENOUS
  Filled 2015-08-08 (×5): qty 50

## 2015-08-08 MED ORDER — ONDANSETRON 4 MG PO TBDP
4.0000 mg | ORAL_TABLET | Freq: Four times a day (QID) | ORAL | Status: DC | PRN
  Filled 2015-08-08: qty 1

## 2015-08-08 MED ORDER — KETOROLAC TROMETHAMINE 15 MG/ML IJ SOLN
15.0000 mg | Freq: Four times a day (QID) | INTRAMUSCULAR | Status: DC | PRN
  Administered 2015-08-08: 15 mg via INTRAVENOUS
  Filled 2015-08-08: qty 1

## 2015-08-08 MED ORDER — ENOXAPARIN SODIUM 40 MG/0.4ML ~~LOC~~ SOLN
40.0000 mg | SUBCUTANEOUS | Status: DC
  Administered 2015-08-08 (×2): 40 mg via SUBCUTANEOUS
  Filled 2015-08-08 (×2): qty 0.4

## 2015-08-08 MED ORDER — ONDANSETRON HCL 4 MG/2ML IJ SOLN: 4.0000 mg | Freq: Four times a day (QID) | INTRAMUSCULAR | Status: DC | PRN

## 2015-08-08 MED ORDER — HYDRALAZINE HCL 20 MG/ML IJ SOLN: 10.0000 mg | INTRAMUSCULAR | Status: DC | PRN

## 2015-08-08 NOTE — ED Notes (Signed)
Pt transported to room 206 

## 2015-08-08 NOTE — Progress Notes (Signed)
Visited patient history reviewed with Dr. Ludwig LeanLaughlin and with the patient and wife. He states currently he is feeling much better had a bowel movement and passed a lot of gas he has no abdominal pain at this point.  Vital signs are stable  Abdomen is distended but nontympanitic huge ventral hernia is present most of it partially reducible and very soft and nontender.  We'll get a KUB and if the KUB shows improvement and he is showing clinical improvement with no pain no nausea or vomiting and partially reducible long-standing chronic huge hernia I would recommend advancing diet after D seeing his NG tube today and possibly discharge tomorrow.  He and his family that this will likely require repair with component separation etc. on an elective basis and that he may experience loss of domain with diaphragm impingement due to the huge amount of bowel that is outside his abdominal cavity currently and has been outside for many many years. With that in mind I recommended tertiary care transfer as an outpatient if we can get him through this situation.

## 2015-08-08 NOTE — Progress Notes (Signed)
Abdominal films personally reviewed and while reading suggests persistent small bowel obstruction there is gas in the rectum and colon and this is clinically consistent with the patient's passage of flatus and feeling much better.  Will discontinue nasogastric tube and start clear liquids

## 2015-08-08 NOTE — Care Management (Signed)
Patient admitted with SBO.  Lives at home with wife.  NG in place. Patient requiring acute O2 at this time.  Reported per nursing that patient is ambulatory.

## 2015-08-08 NOTE — H&P (Signed)
Bryan Savage is an 75 y.o. male.   Chief Complaint: abdominal pain, nausea and vomiting.  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  He comes in today with complaint of abdominal pain and fullness starting on Sunday after taking some olive oil and buttermilk.  He states since then his abdomen has not felt the same.  He has continued to have cramping abdominal pain off and on in waves along his abdomen and got further distended.  He has had a large ventral hernia that was intially repaired 47 yrs prior in [place], as well as appendectomy and inguinal hernia repairs.  He was admitted here with similar bowel obstruction in 2013 which resolved with ng tube decompression.  He states that he had a bowel movement on Monday morning and did pass some gas then but not much.  He states that he got nauseated on Monday and then started vomiting on Tuesday AM.  Since 2013 these same symptoms have happened once but quickly resolved.  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  He does not lift anything heavy but does most things himself. He usually wears and abdominal binder to keep his bowels inside.  He is able to walk up a flight of stairs and do strenuous activity without getting short of breath or chest pain. He only takes a medication for hypertension and cannot remember the name of it currently.  He would like to avoid any unnecessary operations but would be willing to have an operation.  He would rather try to resolve the obstruction and then schedule a surgery so he can make preparations at work.  He denies any fever, chills, malaise, chest pain, SOB, wheezing, melena, diarrhea, dysuria or hematuria.   Past Medical History  Diagnosis Date  . Hypertension   . Bowel obstruction ([place])   Ventral hernia  Past Surgical History  Procedure Laterality Date  . Appendectomy    . Hernia repair       ventral hernia in [place] 40 yr ago    Family History  Problem Relation Age of Onset  . Hypertension Father    Social History:  reports that he has never smoked. He has never used smokeless tobacco. He reports that he does not drink alcohol or use illicit drugs.  Allergies: No Known Allergies   (Not in a hospital admission)  Results for orders placed or performed during the hospital encounter of 08/07/15 (from the past 48 hour(s))  CBC with Differential/Platelet     Status: Abnormal   Collection Time: 08/07/15  6:17 PM  Result Value Ref Range   WBC 8.5 3.8 - 10.6 K/uL   RBC 6.09 (H) 4.40 - 5.90 MIL/uL   Hemoglobin 19.4 (H) 13.0 - 18.0 g/dL   HCT 56.5 (H) 40.0 - 52.0 %   MCV 92.8 80.0 - 100.0 fL   MCH 31.9 26.0 - 34.0 pg   MCHC 34.4 32.0 - 36.0 g/dL   RDW 13.5 11.5 - 14.5 %   Platelets 196 150 - 440 K/uL   Neutrophils Relative % 82 %   Neutro Abs 6.9 (H) 1.4 - 6.5 K/uL   Lymphocytes Relative 10 %   Lymphs Abs 0.9 (L) 1.0 - 3.6 K/uL   Monocytes Relative 8 %   Monocytes Absolute 0.7 0.2 - 1.0 K/uL   Eosinophils Relative 0 %   Eosinophils Absolute 0.0 0 - 0.7  K/uL   Basophils Relative 0 %   Basophils Absolute 0.0 0 - 0.1 K/uL  Lactic acid, plasma     Status: Abnormal   Collection Time: 08/07/15  6:17 PM  Result Value Ref Range   Lactic Acid, Venous 2.0 (HH) 0.5 - 1.9 mmol/L    Comment: CRITICAL RESULT CALLED TO, READ BACK BY AND VERIFIED WITH  KILEY WALKER AT 1858 08/07/15 SDR   Comprehensive metabolic panel     Status: Abnormal   Collection Time: 08/07/15  6:17 PM  Result Value Ref Range   Sodium 139 135 - 145 mmol/L   Potassium 3.8 3.5 - 5.1 mmol/L   Chloride 93 (L) 101 - 111 mmol/L   CO2 32 22 - 32 mmol/L   Glucose, Bld 125 (H) 65 - 99 mg/dL   BUN 31 (H) 6 - 20 mg/dL   Creatinine, Ser 1.18 0.61 - 1.24 mg/dL   Calcium 11.3 (H) 8.9 - 10.3 mg/dL   Total Protein 8.3 (H) 6.5 - 8.1 g/dL   Albumin 4.8 3.5 - 5.0 g/dL   AST 38 15 - 41 U/L   ALT 32 17 - 63 U/L   Alkaline  Phosphatase 29 (L) 38 - 126 U/L   Total Bilirubin 1.9 (H) 0.3 - 1.2 mg/dL   GFR calc non Af Amer 59 (L) >60 mL/min   GFR calc Af Amer >60 >60 mL/min    Comment: (NOTE) The eGFR has been calculated using the CKD EPI equation. This calculation has not been validated in all clinical situations. eGFR's persistently <60 mL/min signify possible Chronic Kidney Disease.    Anion gap 14 5 - 15  Lipase, blood     Status: None   Collection Time: 08/07/15  6:17 PM  Result Value Ref Range   Lipase 26 11 - 51 U/L  Magnesium     Status: None   Collection Time: 08/07/15  6:17 PM  Result Value Ref Range   Magnesium 2.2 1.7 - 2.4 mg/dL   Dg Abd 1 View  08/07/2015  CLINICAL DATA:  Bowel obstruction.  Nasogastric tube placement. EXAM: ABDOMEN - 1 VIEW COMPARISON:  Abdominal radiograph August 07, 2015 at 2116 hours FINDINGS: Nasogastric tube tip projects in proximal stomach, side port above the GE junction. Included bowel gas pattern is nondilated and nonobstructive. Similar consolidation LEFT lung base. IMPRESSION: Nasogastric tube tip projects in proximal stomach. Electronically Signed   By: Elon Alas M.D.   On: 08/07/2015 22:23   Dg Abd 1 View  08/07/2015  CLINICAL DATA:  NG placement EXAM: ABDOMEN - 1 VIEW COMPARISON:  None. FINDINGS: Nasogastric tube is coiled within the lower esophagus with tip directed upwards and at the level of the mid esophagus. IMPRESSION: Nasogastric tube coiled within the esophagus, tip directed upwards, tip at the level of the mid thoracic esophagus. Recommend repositioning/advancement and repeat plain film to ensure appropriate positioning. Lucency under the left hemidiaphragm is presumably air in the stomach as there was no free intraperitoneal air identified on CT abdomen performed earlier today at 7:45 p.m. Recommend attention to this finding on the subsequent plain film exam. Electronically Signed   By: Franki Cabot M.D.   On: 08/07/2015 21:35   Ct Abdomen Pelvis W  Contrast  08/07/2015  CLINICAL DATA:  Extensive aortic atherosclerosis. EXAM: CT ABDOMEN AND PELVIS WITH CONTRAST TECHNIQUE: Multidetector CT imaging of the abdomen and pelvis was performed using the standard protocol following bolus administration of intravenous contrast. CONTRAST:  122m ISOVUE-300 IOPAMIDOL (ISOVUE-300)  INJECTION 61% COMPARISON:  Jul 01, 2011 CT abdomen and pelvis; abdomen series August 07, 2015 FINDINGS: Lower chest: There is scarring in the anterior and lateral left base regions. Lung bases otherwise are clear. There are foci of coronary artery calcification present. There is dilatation of the distal esophagus, likely due to the bowel obstruction more distally. Hepatobiliary: There are multiple cysts scattered throughout the liver, stable. The largest cyst is in the anterior segment of the right lobe of the liver near the dome measuring 2.2 x 2.0 cm. No noncystic liver masses are apparent. Gallbladder is mildly distended with several gallstones within the gallbladder. The gallbladder wall is not appreciably thickened. There is no biliary duct dilatation. Pancreas: Pancreas is somewhat atrophic. There is no pancreatic mass or inflammatory focus. Spleen: No splenic lesions are evident. Adrenals/Urinary Tract: Adrenals appear stable compared to prior study. There is slight left adrenal hypertrophy, stable. Right adrenal appears normal. There are several cysts in the left kidney. The largest cyst is in the periphery of the upper pole of the left kidney laterally measuring 3.0 x 2.5 cm. There are tiny cysts in the right kidney. There is no hydronephrosis on either side. There is no renal or ureteral calculus on either side. Urinary bladder is midline with wall thickness within normal limits. Stomach/Bowel: There is diffuse dilatation of the stomach and small bowel to the level of a transition zone in the mid to distal ileum. This transition zone is best appreciated on sagittal slice 086 series 6,  located just proximal to bowel extending into a a sizable ventral hernia. There are multiple loops of small and large bowel within this large hernia. There is no appreciable bowel wall or mesenteric thickening. No free air or portal venous air is evident. There is no bowel pneumatosis. There are multiple sigmoid diverticula but no diverticulitis evident. Vascular/Lymphatic: There is atherosclerotic calcification in the aorta. The aorta is somewhat ectatic. There is no abdominal aortic aneurysm. There is also calcification in common and internal iliac arteries bilaterally. Major mesenteric vessels appear patent. There is no adenopathy in the abdomen or pelvis. Reproductive: The prostate is prominent with multiple prostatic calculi. Seminal vesicles appear unremarkable. No pelvic mass or pelvic fluid collection is evident. Other: The appendix resides within the ventral hernia. There is no periappendiceal region inflammation. The ventral hernia measures 10.8 cm from right to left and 4.8 cm from superior to inferior. As noted above, multiple loops of small and large bowel extending into this hernia, more toward the right lateral abdomen compared to the left. There is no ascites or abscess in the abdomen or pelvis. Musculoskeletal: There is degenerative change in the lumbar spine. 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 There are no blastic or lytic bone lesions. No intramuscular lesions are evident. IMPRESSION: Small bowel obstruction with transition zone in the mid to distal ileum. The transition zone is located near the inferior aspect of a large ventral hernia. 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 The cecum and appendix are located within the rightward aspect of this large ventral hernia. There are loops of dilated jejunum as well as nondilated distal ileum within this hernia. Sigmoid diverticulosis without diverticulitis. Gallbladder mildly distended with cholelithiasis. Gallbladder  wall not thickened. Prostate is prominent contains multiple calculi. Advise correlation with PSA. No renal or ureteral calculi.  No hydronephrosis. Aortic atherosclerosis. Multiple foci of coronary artery calcification. No bowel pneumatosis.  No abscess.  No ascites. Electronically Signed   By: Lowella Grip  III M.D.   On: 08/07/2015 20:07   Dg Abd Acute W/chest  08/07/2015  CLINICAL DATA:  Nausea, vomiting and abdominal distension since yesterday. Previous right-sided abdominal wall hernia repair approximately 20 years ago. EXAM: DG ABDOMEN ACUTE W/ 1V CHEST COMPARISON:  Abdomen plain film dated 07/02/2011. FINDINGS: Single view of the chest: Heart size is normal. There is age-related aortic ectasia. Atherosclerotic changes noted at the aortic arch. There is slight elevation of the left hemidiaphragm with overlying atelectasis and/or scarring. Lungs otherwise clear. Osseous structures about the chest are unremarkable. Supine and upright views of the abdomen: There are moderately distended gas-filled loops of bowel within the central abdomen and right abdomen, most likely small bowel, with associated air-fluid levels. Suspect right-sided abdominal wall hernia. No evidence of free intraperitoneal air appreciated. IMPRESSION: 1. Probable small bowel obstruction, possibly related to recurrent right abdominal wall hernia. Recommend abdomen/pelvis CT with oral and IV contrast for further characterization. 2. No evidence of acute cardiopulmonary abnormality. 3. Aortic atherosclerosis. Electronically Signed   By: Franki Cabot M.D.   On: 08/07/2015 18:09    Review of Systems  Constitutional: Negative for fever, chills, weight loss and malaise/fatigue.  HENT: Negative for nosebleeds and sore throat.   Respiratory: Negative for cough, sputum production, shortness of breath and wheezing.   Cardiovascular: Negative for chest pain, palpitations, claudication and leg swelling.  Gastrointestinal: Positive for  nausea, vomiting, abdominal pain and constipation. Negative for heartburn, diarrhea, blood in stool and melena.  Genitourinary: Negative for dysuria, urgency, frequency and hematuria.  Musculoskeletal: Negative for back pain, joint pain and falls.  Skin: Negative for itching and rash.  Neurological: Negative for dizziness, loss of consciousness, weakness and headaches.  Psychiatric/Behavioral: Negative for depression. The patient is not nervous/anxious.   All other systems reviewed and are negative.   Blood pressure 149/85, pulse 90, temperature 97.6 F (36.4 C), temperature source Oral, resp. rate 23, height 5' 7"  (1.702 m), weight 170 lb (77.111 kg), SpO2 94 %. Physical Exam  Vitals reviewed. Constitutional: He is oriented to person, place, and time. He appears well-developed and well-nourished. No distress.  HENT:  Head: Normocephalic and atraumatic.  Right Ear: External ear normal.  Left Ear: External ear normal.  Nose: Nose normal.  Mouth/Throat: Oropharynx is clear and moist. No oropharyngeal exudate.  Eyes: Conjunctivae and EOM are normal. Pupils are equal, round, and reactive to light. No scleral icterus.  Neck: Normal range of motion. Neck supple. No tracheal deviation present.  Cardiovascular: Normal rate, regular rhythm, normal heart sounds and intact distal pulses.  Exam reveals no gallop and no friction rub.   No murmur heard. Respiratory: Effort normal and breath sounds normal. No respiratory distress. He has no wheezes. He has no rales.  GI: Soft. He exhibits distension. There is tenderness. There is no rebound and no guarding.  Large ventral hernia approximately 10 x 5cm in size in the epigastric area, some bowel contents can be reduced but not all, well healed midline scar  Musculoskeletal: Normal range of motion. He exhibits no edema or tenderness.  Neurological: He is alert and oriented to person, place, and time. No cranial nerve deficit.  Skin: Skin is warm and dry.  No rash noted. No erythema. No pallor.  Psychiatric: He has a normal mood and affect. His behavior is normal. Judgment and thought content normal.     Assessment/Plan 75 yr old with small bowel obstruction due to adhesions as well as large ventral hernia.  I have personally  reviewed his past medical history and his past hospital stay for the same diagnosis.  He had the same hernia with adhesion within the hernia sac but not incarceration at the hernia neck in 2013.  I have personally reviewed his laboratory values which show some dehydration with elevated hemoglobin and BUN but normal Creatine for age.  He does have a slightly elevated Lactic acid at 2.0 as well.  I have personally reviewed his CT scan images which show a large ventral hernia about 10cm x 4cm in size with almost all of the small and large bowel contained in the hernia, with loops of dilated bowel in the hernia sac and in the abdomen and stool in the colon, there is a smaller hernia just beneath the larger one that is about 4cm in size, in comparison to the 2013 images the hernia is slightly larger with much more bowel inside the hernia sac.  The bowel does not appear to have a transition point at the neck of the large mouth hernia but likely from adhesions, as was the case last time.  I have also reviewed the radiology read as above.    I have discussed with the patient that the first line of treatment is usually NG tube decompression and rehydration and that in 85% of cases an emergent surgery could be avoided.  I did explain that he likely had scar tissue inside the hernia sac that was causing the problem.  I explained that with the large ventral hernia he likely would need repair.  I also explained that if his obstruction resolved during this admission he could get the surgery electively performed but if he did not resolve the obstruction he would need operative intervention during this admission.  He and his wife were given the  opportunity to ask questions and have them answered and are in agreement with the plan as above.  Will admit to inpatient, NG tube, IV fluids, Iv pain medication with repeat abdominal xray and labs in AM.  Hubbard Robinson, MD 08/08/2015, 12:13 AM

## 2015-08-09 ENCOUNTER — Inpatient Hospital Stay: Payer: Medicare HMO

## 2015-08-09 DIAGNOSIS — K5669 Other intestinal obstruction: Secondary | ICD-10-CM

## 2015-08-09 DIAGNOSIS — K56609 Unspecified intestinal obstruction, unspecified as to partial versus complete obstruction: Secondary | ICD-10-CM | POA: Insufficient documentation

## 2015-08-09 NOTE — Progress Notes (Signed)
Alert and oriented. Vital signs stable . No signs of acute distress. Discharge instructions given. Patient verbalizes understanding. No other issues noted at this time.   

## 2015-08-09 NOTE — Discharge Instructions (Signed)
Resume home medications and activities Maintain a soft diet for 1 more week Follow up with an appointment with the department of surgery [facility] for ultimate repair of large recurrent ventral hernia. Should your symptoms return you should proceed [facility] to be evaluated by their surgical service.

## 2015-08-09 NOTE — Discharge Summary (Signed)
Physician Discharge Summary  Patient ID: Bryan Savage O Llorente MRN: [id number] DOB/AGE: 09-15-1940 75 y.o.  Admit date: 08/07/2015 Discharge date: 08/09/2015   Discharge Diagnoses:  Principal Problem:   Small bowel obstruction due to adhesions [facility](HCC) Active Problems:   Ventral hernia, recurrent   Procedures:none  Hospital Course: This a patient with a partial small bowel obstruction which is completely resolved. 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 He is experiencing loss of domain as well. The patient's symptoms have completely resolved he is tolerating a soft diet and is to be discharged at this point he is having bowel movements he is passing gas and his nasogastric tube is been out for 24 hours without nausea or vomiting. He has no pain.  I discussed with him the need for follow-up electively at [facility] department surgery for potential repair of this and I discussed with him the success rate [facility] would be much higher than it would be at [facility]. I also discussed with him that should his symptoms of bowel obstruction returned he should go [facility] to have this dealt with and repaired where the success rate would be highest. He and his family understood and agreed with this plan no new medications were sent.  Consults: none  Disposition:      Medication List    TAKE these medications        aspirin EC 81 MG tablet  Take 81 mg by mouth.     atenolol 50 MG tablet  Commonly known as:  TENORMIN  Take 50 mg by mouth 2 (two) times daily.     CENTRUM SILVER 50+MEN PO  Take 1 tablet by mouth daily.     Melatonin 3 MG Caps  Take 1 capsule by mouth at bedtime as needed.           Follow-up Information    [facility] In 2 weeks.      Lattie Hawichard E Cooper, MD, FACS

## 2015-08-09 NOTE — Progress Notes (Signed)
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 He experiences experiencing both of partial small bowel obstruction which has resolved and loss of domain. At this point his pain is gone he is passing gas having bowel movements and his nasogastric tube is been out for 24 hours without nausea or vomiting denies fevers or chills. He wants to go home.  Objective: Vital signs in last 24 hours: Temp:  [97.7 F (36.5 C)-98.4 F (36.9 C)] 98.2 F (36.8 C) (07/06 0438) Pulse Rate:  [57-69] 57 (07/06 0438) Resp:  [16-18] 16 (07/06 0438) BP: (130-146)/(65-83) 130/81 mmHg (07/06 0438) SpO2:  [93 %-100 %] 93 % (07/06 0438) Last BM Date: 08/09/15  Intake/Output from previous day: 07/05 0701 - 07/06 0700 In: 2427.4 [P.O.:750; I.V.:1544.5; NG/GT:30; IV Piggyback:102.9] Out: 90 [Emesis/NG output:90] Intake/Output this shift: Total I/O In: 660 [P.O.:660] Out: -   Physical exam:  Abdomen shows a huge recurrent ventral hernia mostly to the right of midline which is soft nonerythematous nontender partially reducible Calves are nontender No icterus no jaundice She is awake alert and oriented  Lab Results: CBC   Recent Labs  08/07/15 1817 08/08/15 0445  WBC 8.5 7.7  HGB 19.4* 18.2*  HCT 56.5* 52.8*  PLT 196 157   BMET  Recent Labs  08/07/15 1817 08/08/15 0445  NA 139 141  K 3.8 3.6  CL 93* 92*  CO2 32 41*  GLUCOSE 125* 160*  BUN 31* 30*  CREATININE 1.18 1.46*  CALCIUM 11.3* 10.1   PT/INR No results for input(s): LABPROT, INR in the last 72 hours. ABG No results for input(s): PHART, HCO3 in the last 72 hours.  Invalid input(s): PCO2, PO2  Studies/Results: Dg Abd 1 View  08/07/2015  CLINICAL DATA:  Bowel obstruction.  Nasogastric tube placement. EXAM: ABDOMEN - 1 VIEW COMPARISON:  Abdominal radiograph August 07, 2015 at 2116 hours FINDINGS: Nasogastric tube tip projects in proximal  stomach, side port above the GE junction. Included bowel gas pattern is nondilated and nonobstructive. Similar consolidation LEFT lung base. IMPRESSION: Nasogastric tube tip projects in proximal stomach. Electronically Signed   By: Awilda Metroourtnay  Bloomer M.D.   On: 08/07/2015 22:23   Dg Abd 1 View  08/07/2015  CLINICAL DATA:  NG placement EXAM: ABDOMEN - 1 VIEW COMPARISON:  None. FINDINGS: Nasogastric tube is coiled within the lower esophagus with tip directed upwards and at the level of the mid esophagus. IMPRESSION: Nasogastric tube coiled within the esophagus, tip directed upwards, tip at the level of the mid thoracic esophagus. Recommend repositioning/advancement and repeat plain film to ensure appropriate positioning. Lucency under the left hemidiaphragm is presumably air in the stomach as there was no free intraperitoneal air identified on CT abdomen performed earlier today at 7:45 p.m. Recommend attention to this finding on the subsequent plain film exam. Electronically Signed   By: Bary RichardStan  Maynard M.D.   On: 08/07/2015 21:35   Ct Abdomen Pelvis W Contrast  08/07/2015  CLINICAL DATA:  Extensive aortic atherosclerosis. EXAM: CT ABDOMEN AND PELVIS WITH CONTRAST TECHNIQUE: Multidetector CT imaging of the abdomen and pelvis was performed using the standard protocol following bolus administration of intravenous contrast. CONTRAST:  100mL ISOVUE-300 IOPAMIDOL (ISOVUE-300) INJECTION 61% COMPARISON:  Jul 01, 2011 CT abdomen and pelvis; abdomen series August 07, 2015 FINDINGS: Lower chest: There is scarring in the anterior and lateral left base regions. Lung bases otherwise are clear. There are foci of coronary  artery calcification present. There is dilatation of the distal esophagus, likely due to the bowel obstruction more distally. Hepatobiliary: There are multiple cysts scattered throughout the liver, stable. The largest cyst is in the anterior segment of the right lobe of the liver near the dome measuring 2.2 x 2.0 cm.  No noncystic liver masses are apparent. Gallbladder is mildly distended with several gallstones within the gallbladder. The gallbladder wall is not appreciably thickened. There is no biliary duct dilatation. Pancreas: Pancreas is somewhat atrophic. There is no pancreatic mass or inflammatory focus. Spleen: No splenic lesions are evident. Adrenals/Urinary Tract: Adrenals appear stable compared to prior study. There is slight left adrenal hypertrophy, stable. Right adrenal appears normal. There are several cysts in the left kidney. The largest cyst is in the periphery of the upper pole of the left kidney laterally measuring 3.0 x 2.5 cm. There are tiny cysts in the right kidney. There is no hydronephrosis on either side. There is no renal or ureteral calculus on either side. Urinary bladder is midline with wall thickness within normal limits. Stomach/Bowel: There is diffuse dilatation of the stomach and small bowel to the level of a transition zone in the mid to distal ileum. This transition zone is best appreciated on sagittal slice 124 series 6, located just proximal to bowel extending into a a sizable ventral hernia. There are multiple loops of small and large bowel within this large hernia. There is no appreciable bowel wall or mesenteric thickening. No free air or portal venous air is evident. There is no bowel pneumatosis. There are multiple sigmoid diverticula but no diverticulitis evident. Vascular/Lymphatic: There is atherosclerotic calcification in the aorta. The aorta is somewhat ectatic. There is no abdominal aortic aneurysm. There is also calcification in common and internal iliac arteries bilaterally. Major mesenteric vessels appear patent. There is no adenopathy in the abdomen or pelvis. Reproductive: The prostate is prominent with multiple prostatic calculi. Seminal vesicles appear unremarkable. No pelvic mass or pelvic fluid collection is evident. Other: The appendix resides within the ventral hernia.  There is no periappendiceal region inflammation. The ventral hernia measures 10.8 cm from right to left and 4.8 cm from superior to inferior. As noted above, multiple loops of small and large bowel extending into this hernia, more toward the right lateral abdomen compared to the left. There is no ascites or abscess in the abdomen or pelvis. Musculoskeletal: There is degenerative change in the lumbar spine. 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 There are no blastic or lytic bone lesions. No intramuscular lesions are evident. IMPRESSION: Small bowel obstruction with transition zone in the mid to distal ileum. The transition zone is located near the inferior aspect of a large ventral hernia. 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 The cecum and appendix are located within the rightward aspect of this large ventral hernia. There are loops of dilated jejunum as well as nondilated distal ileum within this hernia. Sigmoid diverticulosis without diverticulitis. Gallbladder mildly distended with cholelithiasis. Gallbladder wall not thickened. Prostate is prominent contains multiple calculi. Advise correlation with PSA. No renal or ureteral calculi.  No hydronephrosis. Aortic atherosclerosis. Multiple foci of coronary artery calcification. No bowel pneumatosis.  No abscess.  No ascites. Electronically Signed   By: Bretta Bang III M.D.   On: 08/07/2015 20:07   Dg Abd 2 Views  08/09/2015  CLINICAL DATA:  Recurrent ventral hernia. EXAM: ABDOMEN - 2 VIEW COMPARISON:  08/08/2015.  CT 08/07/2015. FINDINGS: Gallstones again noted. Soft tissue structures  are unremarkable persistent small bowel distention consistent with small bowel obstruction. Colon is nondistended. No free air. Left base atelectasis and small left pleural effusion. Tortuous thoracic aorta with atherosclerotic vascular calcification. IMPRESSION: 1. Persistent small bowel distention consistent with small bowel obstruction. No  interim change. 2.  Gallstones again noted. 3. Left base subsegmental atelectasis and or infiltrate with left pleural effusion. 4. Aortic atherosclerosis. Electronically Signed   By: Maisie Fushomas  Register   On: 08/09/2015 07:34   Dg Abd 2 Views  08/08/2015  CLINICAL DATA:  Small bowel obstruction EXAM: ABDOMEN - 2 VIEW COMPARISON:  08/06/2013 FINDINGS: Persistent gaseous distended small bowel loop in right lower abdomen laterally consistent with small bowel obstruction. Gallstones are noted in right upper quadrant. NG tube with tip in mid stomach. No evidence of free abdominal air. IMPRESSION: Persistent gaseous distended small bowel loop in right abdomen laterally consistent with small bowel obstruction. NG tube in place. Electronically Signed   By: Natasha MeadLiviu  Pop M.D.   On: 08/08/2015 14:15   Dg Abd Acute W/chest  08/07/2015  CLINICAL DATA:  Nausea, vomiting and abdominal distension since yesterday. Previous right-sided abdominal wall hernia repair approximately 20 years ago. EXAM: DG ABDOMEN ACUTE W/ 1V CHEST COMPARISON:  Abdomen plain film dated 07/02/2011. FINDINGS: Single view of the chest: Heart size is normal. There is age-related aortic ectasia. Atherosclerotic changes noted at the aortic arch. There is slight elevation of the left hemidiaphragm with overlying atelectasis and/or scarring. Lungs otherwise clear. Osseous structures about the chest are unremarkable. Supine and upright views of the abdomen: There are moderately distended gas-filled loops of bowel within the central abdomen and right abdomen, most likely small bowel, with associated air-fluid levels. Suspect right-sided abdominal wall hernia. No evidence of free intraperitoneal air appreciated. IMPRESSION: 1. Probable small bowel obstruction, possibly related to recurrent right abdominal wall hernia. Recommend abdomen/pelvis CT with oral and IV contrast for further characterization. 2. No evidence of acute cardiopulmonary abnormality. 3. Aortic  atherosclerosis. Electronically Signed   By: Bary RichardStan  Maynard M.D.   On: 08/07/2015 18:09    Anti-infectives: Anti-infectives    None      Assessment/Plan:  KUB is personally reviewed showing dilated small bowel loops. Report suggest ongoing bowel obstruction.  Patient is not experiencing symptoms of a bowel obstruction at this point with this long-standing hernia with loss of domain. He is passing gas having bowel movements his nasogastric tube is been out for 24 hours and he has no pain and no nausea or vomiting and is tolerating a diet he wishes to go home.  Charged in stable condition to stay on a soft diet for the next week or so. I suggested that repair of this electively would be the best choice but that his best chance for success with loss of domain and recurrence would be to have [facility] [place]. [facility] [place]. He should [facility] up as an outpatient [facility] [place] [facility].  I discussed with he and his wife the fact that should this return or should his symptoms return that of small bowel obstruction he should not come [facility] but [facility] where in case he needed emergency surgery for a small bowel obstruction the hernia could be repaired at the same time with the best chance of success.  He understood this plan and questions were answered for him.  Lattie Hawichard E Devika Dragovich, MD, FACS  08/09/2015

## 2017-04-03 DIAGNOSIS — I639 Cerebral infarction, unspecified: Secondary | ICD-10-CM

## 2017-04-03 HISTORY — DX: Cerebral infarction, unspecified: I63.9

## 2017-06-30 ENCOUNTER — Emergency Department: Payer: Medicare HMO

## 2017-06-30 ENCOUNTER — Other Ambulatory Visit: Payer: Self-pay

## 2017-06-30 ENCOUNTER — Inpatient Hospital Stay: Payer: Medicare HMO

## 2017-06-30 ENCOUNTER — Inpatient Hospital Stay
Admission: EM | Admit: 2017-06-30 | Discharge: 2017-07-04 | DRG: 394 | Disposition: A | Discharge status: Home / Self Care | Payer: Medicare HMO | Attending: Surgery | Admitting: Surgery

## 2017-06-30 ENCOUNTER — Encounter: Payer: Self-pay | Admitting: Emergency Medicine

## 2017-06-30 DIAGNOSIS — I10 Essential (primary) hypertension: Secondary | ICD-10-CM | POA: Diagnosis present

## 2017-06-30 DIAGNOSIS — Z7982 Long term (current) use of aspirin: Secondary | ICD-10-CM | POA: Diagnosis not present

## 2017-06-30 DIAGNOSIS — K56609 Unspecified intestinal obstruction, unspecified as to partial versus complete obstruction: Secondary | ICD-10-CM

## 2017-06-30 DIAGNOSIS — K5652 Intestinal adhesions [bands] with complete obstruction: Secondary | ICD-10-CM | POA: Diagnosis not present

## 2017-06-30 DIAGNOSIS — R7989 Other specified abnormal findings of blood chemistry: Secondary | ICD-10-CM

## 2017-06-30 DIAGNOSIS — K436 Other and unspecified ventral hernia with obstruction, without gangrene: Secondary | ICD-10-CM | POA: Diagnosis present

## 2017-06-30 DIAGNOSIS — Z8249 Family history of ischemic heart disease and other diseases of the circulatory system: Secondary | ICD-10-CM | POA: Diagnosis not present

## 2017-06-30 DIAGNOSIS — E860 Dehydration: Secondary | ICD-10-CM | POA: Diagnosis present

## 2017-06-30 DIAGNOSIS — K565 Intestinal adhesions [bands], unspecified as to partial versus complete obstruction: Secondary | ICD-10-CM | POA: Diagnosis present

## 2017-06-30 DIAGNOSIS — Z79899 Other long term (current) drug therapy: Secondary | ICD-10-CM

## 2017-06-30 HISTORY — DX: Ventral hernia without obstruction or gangrene: K43.9

## 2017-06-30 LAB — URINALYSIS, COMPLETE (UACMP) WITH MICROSCOPIC
BACTERIA UA: NONE SEEN
BILIRUBIN URINE: NEGATIVE
Glucose, UA: NEGATIVE mg/dL
Hgb urine dipstick: NEGATIVE
KETONES UR: 5 mg/dL — AB
LEUKOCYTES UA: NEGATIVE
NITRITE: NEGATIVE
PROTEIN: NEGATIVE mg/dL
SPECIFIC GRAVITY, URINE: 1.02 (ref 1.005–1.030)
SQUAMOUS EPITHELIAL / LPF: NONE SEEN (ref 0–5)
pH: 6 (ref 5.0–8.0)

## 2017-06-30 LAB — CBC
HEMATOCRIT: 51.7 % (ref 40.0–52.0)
HEMOGLOBIN: 17.6 g/dL (ref 13.0–18.0)
MCH: 31.8 pg (ref 26.0–34.0)
MCHC: 34 g/dL (ref 32.0–36.0)
MCV: 93.4 fL (ref 80.0–100.0)
Platelets: 257 10*3/uL (ref 150–440)
RBC: 5.54 MIL/uL (ref 4.40–5.90)
RDW: 13.5 % (ref 11.5–14.5)
WBC: 12.6 10*3/uL — ABNORMAL HIGH (ref 3.8–10.6)

## 2017-06-30 LAB — COMPREHENSIVE METABOLIC PANEL
ALBUMIN: 4.3 g/dL (ref 3.5–5.0)
ALT: 35 U/L (ref 17–63)
ANION GAP: 13 (ref 5–15)
AST: 44 U/L — AB (ref 15–41)
Alkaline Phosphatase: 31 U/L — ABNORMAL LOW (ref 38–126)
BUN: 33 mg/dL — AB (ref 6–20)
CHLORIDE: 94 mmol/L — AB (ref 101–111)
CO2: 28 mmol/L (ref 22–32)
Calcium: 11 mg/dL — ABNORMAL HIGH (ref 8.9–10.3)
Creatinine, Ser: 1.11 mg/dL (ref 0.61–1.24)
GFR calc Af Amer: >60 mL/min (ref >60)
GFR calc non Af Amer: >60 mL/min (ref >60)
Glucose, Bld: 123 mg/dL — ABNORMAL HIGH (ref 65–99)
POTASSIUM: 4.6 mmol/L (ref 3.5–5.1)
Sodium: 135 mmol/L (ref 135–145)
Total Bilirubin: 1.3 mg/dL — ABNORMAL HIGH (ref 0.3–1.2)
Total Protein: 8.3 g/dL — ABNORMAL HIGH (ref 6.5–8.1)

## 2017-06-30 LAB — LIPASE, BLOOD: LIPASE: 32 U/L (ref 11–51)

## 2017-06-30 LAB — LACTIC ACID, PLASMA: LACTIC ACID, VENOUS: 2.1 mmol/L — AB (ref 0.5–1.9)

## 2017-06-30 MED ORDER — SODIUM CHLORIDE 0.9 % IV BOLUS
500.0000 mL | INTRAVENOUS | Status: AC
  Administered 2017-06-30: 500 mL via INTRAVENOUS

## 2017-06-30 MED ORDER — IOPAMIDOL (ISOVUE-300) INJECTION 61%
100.0000 mL | Freq: Once | INTRAVENOUS | Status: AC | PRN
  Administered 2017-06-30: 100 mL via INTRAVENOUS
  Filled 2017-06-30: qty 100

## 2017-06-30 MED ORDER — DIPHENHYDRAMINE HCL 25 MG PO CAPS: 25.0000 mg | ORAL_CAPSULE | Freq: Four times a day (QID) | ORAL | Status: DC | PRN

## 2017-06-30 MED ORDER — MORPHINE SULFATE (PF) 2 MG/ML IV SOLN
2.0000 mg | INTRAVENOUS | Status: DC | PRN
  Administered 2017-07-01 (×2): 2 mg via INTRAVENOUS
  Filled 2017-06-30 (×2): qty 1

## 2017-06-30 MED ORDER — ENOXAPARIN SODIUM 40 MG/0.4ML ~~LOC~~ SOLN
40.0000 mg | SUBCUTANEOUS | Status: DC
  Administered 2017-06-30 – 2017-07-03 (×4): 40 mg via SUBCUTANEOUS
  Filled 2017-06-30 (×4): qty 0.4

## 2017-06-30 MED ORDER — KETOROLAC TROMETHAMINE 15 MG/ML IJ SOLN
15.0000 mg | Freq: Four times a day (QID) | INTRAMUSCULAR | Status: DC | PRN
Start: 2017-06-30 — End: 2017-07-04
  Administered 2017-06-30 – 2017-07-03 (×6): 15 mg via INTRAVENOUS
  Filled 2017-06-30 (×6): qty 1

## 2017-06-30 MED ORDER — DIPHENHYDRAMINE HCL 50 MG/ML IJ SOLN
25.0000 mg | Freq: Four times a day (QID) | INTRAMUSCULAR | Status: DC | PRN
  Administered 2017-07-01: 25 mg via INTRAVENOUS
  Filled 2017-06-30: qty 1

## 2017-06-30 MED ORDER — HYDRALAZINE HCL 20 MG/ML IJ SOLN
10.0000 mg | INTRAMUSCULAR | Status: DC | PRN
  Administered 2017-06-30: 10 mg via INTRAVENOUS
  Filled 2017-06-30: qty 1

## 2017-06-30 MED ORDER — ONDANSETRON 4 MG PO TBDP: 4.0000 mg | ORAL_TABLET | Freq: Four times a day (QID) | ORAL | Status: DC | PRN

## 2017-06-30 MED ORDER — ONDANSETRON HCL 4 MG/2ML IJ SOLN
4.0000 mg | INTRAMUSCULAR | Status: AC
Start: 2017-06-30 — End: 2017-06-30
  Administered 2017-06-30: 4 mg via INTRAVENOUS

## 2017-06-30 MED ORDER — FAMOTIDINE IN NACL 20-0.9 MG/50ML-% IV SOLN
20.0000 mg | Freq: Two times a day (BID) | INTRAVENOUS | Status: DC
  Administered 2017-07-01 – 2017-07-04 (×7): 20 mg via INTRAVENOUS
  Filled 2017-06-30 (×7): qty 50

## 2017-06-30 MED ORDER — LACTATED RINGERS IV SOLN
INTRAVENOUS | Status: DC
  Administered 2017-06-30 – 2017-07-04 (×11): via INTRAVENOUS

## 2017-06-30 MED ORDER — ONDANSETRON HCL 4 MG/2ML IJ SOLN: 4.0000 mg | Freq: Four times a day (QID) | INTRAMUSCULAR | Status: DC | PRN

## 2017-06-30 MED ORDER — ONDANSETRON HCL 4 MG/2ML IJ SOLN
INTRAMUSCULAR | Status: AC
  Administered 2017-06-30: 4 mg via INTRAVENOUS
  Filled 2017-06-30: qty 2

## 2017-06-30 NOTE — ED Provider Notes (Addendum)
[facility] Emergency Department Provider Note  ____________________________________________   First MD Initiated Contact with Patient 06/30/17 2117     (approximate)  I have reviewed the triage vital signs and the nursing notes.   HISTORY  Chief Complaint Abdominal Pain and Dehydration    HPI Bryan Savage is a 77 y.o. male with a history of a chronic ventral hernia leading to a partial SBO in 2017 who presents for evaluation of acute onset abdominal pain with nausea, vomiting, and decreased appetite.  It feels similar to his prior obstruction.  The symptoms started yesterday acutely and waxed and waned in severity.  He states that it started after he ate too much the day before and he knows that he overdid it.  He went to his primary care provider today she told him that he was dehydrated but after he left the PCPs office was when the pain began to be severe.  He has nausea and has not had much of any passage of gas and no bowel movement today although he feels like he would benefit from it.  He describes his symptoms as severe and nothing is making them better.  He vomited during the course of my history and physical which was the first time he had actively vomiting.  Past Medical History:  Diagnosis Date  . Bowel obstruction (HCC)   . Hypertension   . Ventral hernia    had SBO in the hernia in 2017 and 2019    Patient Active Problem List   Diagnosis Date Noted  . SBO (small bowel obstruction) (HCC)   . Small bowel obstruction (HCC)   . Small bowel obstruction due to adhesions (HCC) 08/08/2015  . Ventral hernia, recurrent 08/08/2015  . Essential hypertension     Past Surgical History:  Procedure Laterality Date  . APPENDECTOMY    . HERNIA REPAIR     ventral hernia in [place] 35 yr ago  . TONSILLECTOMY      Prior to Admission medications   Medication Sig Start Date End Date Taking? Authorizing Provider  aspirin EC 81 MG tablet Take 81 mg by  mouth.   Yes [provider]  atenolol (TENORMIN) 50 MG tablet Take 50 mg by mouth 2 (two) times daily.   Yes [provider]  atorvastatin (LIPITOR) 40 MG tablet Take 40 mg by mouth daily.   Yes [provider]  Melatonin 3 MG CAPS Take 1 capsule by mouth at bedtime as needed.   Yes [provider]  Multiple Vitamins-Minerals (CENTRUM SILVER 50+MEN PO) Take 1 tablet by mouth daily.   Yes [provider]  triamterene-hydrochlorothiazide (DYAZIDE) 37.5-25 MG capsule Take 1 capsule by mouth daily.   Yes [provider]    Allergies Patient has no known allergies.  Family History  Problem Relation Age of Onset  . Hypertension Father     Social History Social History   Tobacco Use  . Smoking status: Never Smoker  . Smokeless tobacco: Never Used  Substance Use Topics  . Alcohol use: No  . Drug use: No    Review of Systems Constitutional: No fever/chills Eyes: No visual changes. ENT: No sore throat. Cardiovascular: Denies chest pain. Respiratory: Denies shortness of breath. Gastrointestinal: Abdominal pain, nausea, and vomiting as described above.  Not passing flatus. Genitourinary: Negative for dysuria. Musculoskeletal: Negative for neck pain.  Negative for back pain. Integumentary: Negative for rash. Neurological: Negative for headaches, focal weakness or numbness.   ____________________________________________  PHYSICAL EXAM:  VITAL SIGNS: ED Triage Vitals  Enc Vitals Group     BP 06/30/17 1730 (!) 151/94     Pulse Rate 06/30/17 1730 60     Resp 06/30/17 1730 16     Temp 06/30/17 1730 98.2 F (36.8 C)     Temp Source 06/30/17 1730 Oral     SpO2 06/30/17 1730 94 %     Weight 06/30/17 1730 72.6 kg (160 lb)     Height 06/30/17 1730 1.778 m ( )     Head Circumference --      Peak Flow --      Pain Score 06/30/17 1734 4     Pain Loc --      Pain Edu? --      Excl. in GC? --     Constitutional: Alert  and oriented. Well appearing and in no acute distress. Eyes: Conjunctivae are normal.  Head: Atraumatic. Nose: No congestion/rhinnorhea. Mouth/Throat: Mucous membranes are moist. Neck: No stridor.  No meningeal signs.   Cardiovascular: Normal rate, regular rhythm. Good peripheral circulation. Grossly normal heart sounds. Respiratory: Normal respiratory effort.  No retractions. Lungs CTAB. Gastrointestinal: Extremely large ventral hernia that the patient states is been chronic for decades.  Soft, no induration, easily reducible, nontender at this time.  He does feel distended. Musculoskeletal: No lower extremity tenderness nor edema. No gross deformities of extremities. Neurologic:  Normal speech and language. No gross focal neurologic deficits are appreciated.  Skin:  Skin is warm, dry and intact. No rash noted. Psychiatric: Mood and affect are normal. Speech and behavior are normal.  ____________________________________________   LABS (all labs ordered are listed, but only abnormal results are displayed)  Labs Reviewed  COMPREHENSIVE METABOLIC PANEL - Abnormal; Notable for the following components:      Result Value   Chloride 94 (*)    Glucose, Bld 123 (*)    BUN 33 (*)    Calcium 11.0 (*)    Total Protein 8.3 (*)    AST 44 (*)    Alkaline Phosphatase 31 (*)    Total Bilirubin 1.3 (*)    All other components within normal limits  CBC - Abnormal; Notable for the following components:   WBC 12.6 (*)    All other components within normal limits  URINALYSIS, COMPLETE (UACMP) WITH MICROSCOPIC - Abnormal; Notable for the following components:   Color, Urine AMBER (*)    APPearance HAZY (*)    Ketones, ur 5 (*)    All other components within normal limits  LACTIC ACID, PLASMA - Abnormal; Notable for the following components:   Lactic Acid, Venous 2.1 (*)    All other components within normal limits  LIPASE, BLOOD  LACTIC ACID, PLASMA  CBC  CREATININE, SERUM  COMPREHENSIVE  METABOLIC PANEL  MAGNESIUM  CBC   ____________________________________________  EKG  None - EKG not ordered by ED physician ____________________________________________  RADIOLOGY Marylou Mccoy, personally viewed and evaluated these images (plain radiographs) as part of my medical decision making, as well as reviewing the written report by the radiologist.   ED MD interpretation: SBO with transition point within the large ventral hernia  Official radiology report(s): Dg Abdomen 1 View  Result Date: 06/30/2017 CLINICAL DATA:  Abdominal pain since yesterday. EXAM: ABDOMEN - 1 VIEW COMPARISON:  Jun 30, 2016 CT scan.  August 09, 2015 KUB FINDINGS: An NG tube terminates in the stomach. Lung bases are unremarkable. No free air, portal venous gas, pneumatosis. The  upper abdomen is otherwise normal. IMPRESSION: The NG tube terminates in the stomach.  No acute abnormalities. Electronically Signed   By: Gerome Sam III M.D   On: 06/30/2017 23:14   Ct Abdomen Pelvis W Contrast  Result Date: 06/30/2017 CLINICAL DATA:  Abdominal pain, history of bowel obstruction EXAM: CT ABDOMEN AND PELVIS WITH CONTRAST TECHNIQUE: Multidetector CT imaging of the abdomen and pelvis was performed using the standard protocol following bolus administration of intravenous contrast. CONTRAST:  ISOVUE-300 IOPAMIDOL (ISOVUE-300) INJECTION 61% COMPARISON:  08/07/2015 FINDINGS: Lower chest: Lung bases are essentially clear. Hepatobiliary: Scattered hepatic cysts, measuring up to 16 mm in segment 4A. Layering gallstones, measuring up to 18 mm, without associated inflammatory changes. No intrahepatic or extrahepatic ductal dilatation. Pancreas: Mild parenchymal atrophy.  No ductal dilatation. Spleen: Within normal limits. Adrenals/Urinary Tract: Adrenal glands are within normal limits. Right kidney is within normal limits. Left renal cysts, measuring up to 3.4 cm in the lateral interpolar region (series 2/image 35). No  hydronephrosis. Bladder is within normal limits. Stomach/Bowel: Stomach is within normal limits. Large complex ventral hernia containing multiple dilated loops of small bowel (series 302/image 38), similar to the prior. Decompressed loops of distal ileum within the right lower aspect of the hernia (series 2/image 86), reflecting transition within the hernia sac. No small bowel wall thickening or pneumatosis. Ascending colon and proximal/mid transverse colon are located within the hernia. Distal transverse colon, descending colon, and rectosigmoid colon are within the abdomen. These remain decompressed. Sigmoid diverticulosis, without evidence of diverticulitis. 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 Atherosclerotic calcifications of the abdominal aorta and branch vessels. No suspicious abdominopelvic lymphadenopathy. Reproductive: Prostatomegaly. Other: Trace pelvic ascites. No free air. Musculoskeletal: Degenerative changes of the visualized thoracolumbar spine. IMPRESSION: Small bowel obstruction with transition involving the ileum, located within a chronic large complex ventral hernia, as described above. This appearance is similar to the prior from 2017. No small bowel wall thickening or pneumatosis to suggest an at risk loop of bowel. Small volume pelvic ascites. No free air. Cholelithiasis, without associated inflammatory changes. 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 Recommend followup by ultrasound in 3 years. This recommendation follows ACR consensus guidelines: White Paper of the ACR Incidental Findings Committee II on Vascular Findings. J Am Coll Radiol 2013; 10:789-794. Electronically Signed   By: Charline Bills M.D.   On: 06/30/2017 21:56    ____________________________________________   PROCEDURES  Critical Care performed: No   Procedure(s) performed:    Procedures   ____________________________________________   INITIAL IMPRESSION / ASSESSMENT AND PLAN / ED COURSE  As part of my medical decision making, I reviewed the following data within the electronic MEDICAL RECORD NUMBER History obtained from family, Nursing notes reviewed and incorporated, Labs reviewed , Old chart reviewed, Discussed with admitting physician , A consult was requested and obtained from this/these consultant(s) Surgery and Notes from prior ED visits    Differential diagnosis includes, but is not limited to, SBO/ileus, acute intra-abdominal infection such as appendicitis or cholecystitis, mesenteric ischemia.  By far, bowel obstruction is most likely diagnosis and his clinical presentation is consistent with this.  Lab results are generally reassuring with a mild leukocytosis of 12.6, generally reassuring conference of metabolic panel, unremarkable urinalysis, normal lipase, but an elevated lactic acid of 2.1 which I believe is the result of volume depletion secondary to his bowel obstruction rather than a sign of sepsis.  I am providing a  liter of normal saline by IV and the patient received Zofran 4 mg IV.  He says he does not need pain medicine at this time.  Will obtain the CT scan and the likely consult surgery.  He was admitted to Dr. Excell Seltzer 2 years ago and resolved spontaneously with bowel rest.  Clinical Course as of Jun 30 2316  Tue Jun 30, 2017  2208 +SBO.  Will discuss with surgery.  CT ABDOMEN PELVIS W CONTRAST [CF]  2311 I discussed the case with Dr. Everlene Farrier by phone twice and he reviewed the CT scan and is admitting the patient.  ED nurses placed NG tube and the patient felt better afterwards.  Patient stable for transfer to the floor.   [CF]  2317 Reviewed abdominal radiograph and radiologist's report.  NG tube appropriately placed.   [CF]    Clinical Course User Index [CF] Loleta Rose, MD    ____________________________________________  FINAL CLINICAL  IMPRESSION(S) / ED DIAGNOSES  Final diagnoses:  Small bowel obstruction (HCC)  Elevated lactic acid level     MEDICATIONS GIVEN DURING THIS VISIT:  Medications  sodium chloride 0.9 % bolus 500 mL (500 mLs Intravenous New Bag/Given 06/30/17 2244)  enoxaparin (LOVENOX) injection 40 mg (has no administration in time range)  lactated ringers infusion (has no administration in time range)  morphine 2 MG/ML injection 2 mg (has no administration in time range)  ketorolac (TORADOL) 15 MG/ML injection 15 mg (has no administration in time range)  diphenhydrAMINE (BENADRYL) capsule 25 mg (has no administration in time range)    Or  diphenhydrAMINE (BENADRYL) injection 25 mg (has no administration in time range)  ondansetron (ZOFRAN-ODT) disintegrating tablet 4 mg (has no administration in time range)    Or  ondansetron (ZOFRAN) injection 4 mg (has no administration in time range)  famotidine (PEPCID) IVPB 20 mg premix (has no administration in time range)  hydrALAZINE (APRESOLINE) injection 10 mg (has no administration in time range)  ondansetron (ZOFRAN) injection 4 mg (4 mg Intravenous Given 06/30/17 2147)  sodium chloride 0.9 % bolus 500 mL (0 mLs Intravenous Stopped 06/30/17 2253)  iopamidol (ISOVUE-300) 61 % injection 100 mL (100 mLs Intravenous Contrast Given 06/30/17 2134)     ED Discharge Orders    None       Note:  This document was prepared using Dragon voice recognition software and may include unintentional dictation errors.    Loleta Rose, MD 06/30/17 Daneil Dolin    Loleta Rose, MD 06/30/17 2318

## 2017-06-30 NOTE — ED Notes (Signed)
Patient reports tightness and pain in abdomen starting this morning. Patient reports abdomen has become increasing large and tight since lunch time. Denies N/V/D. Reports history of intestinal blockage with similar symptoms.

## 2017-06-30 NOTE — ED Triage Notes (Signed)
Patient presents to the ED with abdominal pain since yesterday.  Patient went to PCP today and she told him he was dehydrated based on his blood work.  Patient states, "she didn't know I was coming over here, but I started really hurting!"  Patient states he has a history of GI blockages.  Patient's last BM was yesterday.  Denies vomiting.

## 2017-07-01 ENCOUNTER — Inpatient Hospital Stay: Payer: Medicare HMO

## 2017-07-01 DIAGNOSIS — K5652 Intestinal adhesions [bands] with complete obstruction: Secondary | ICD-10-CM

## 2017-07-01 LAB — COMPREHENSIVE METABOLIC PANEL
ALT: 31 U/L (ref 17–63)
AST: 38 U/L (ref 15–41)
Albumin: 3.8 g/dL (ref 3.5–5.0)
Alkaline Phosphatase: 28 U/L — ABNORMAL LOW (ref 38–126)
Anion gap: 9 (ref 5–15)
BILIRUBIN TOTAL: 1.2 mg/dL (ref 0.3–1.2)
BUN: 31 mg/dL — ABNORMAL HIGH (ref 6–20)
CO2: 27 mmol/L (ref 22–32)
Calcium: 10 mg/dL (ref 8.9–10.3)
Chloride: 98 mmol/L — ABNORMAL LOW (ref 101–111)
Creatinine, Ser: 1.11 mg/dL (ref 0.61–1.24)
Glucose, Bld: 127 mg/dL — ABNORMAL HIGH (ref 65–99)
POTASSIUM: 4.7 mmol/L (ref 3.5–5.1)
Sodium: 134 mmol/L — ABNORMAL LOW (ref 135–145)
TOTAL PROTEIN: 7.3 g/dL (ref 6.5–8.1)

## 2017-07-01 LAB — CBC
HCT: 51.4 % (ref 40.0–52.0)
HEMOGLOBIN: 17.5 g/dL (ref 13.0–18.0)
MCH: 31.8 pg (ref 26.0–34.0)
MCHC: 34 g/dL (ref 32.0–36.0)
MCV: 93.6 fL (ref 80.0–100.0)
Platelets: 239 10*3/uL (ref 150–440)
RBC: 5.49 MIL/uL (ref 4.40–5.90)
RDW: 14 % (ref 11.5–14.5)
WBC: 8.7 10*3/uL (ref 3.8–10.6)

## 2017-07-01 LAB — MAGNESIUM: MAGNESIUM: 1.8 mg/dL (ref 1.7–2.4)

## 2017-07-01 MED ORDER — HYDROMORPHONE HCL 1 MG/ML IJ SOLN
1.0000 mg | Freq: Once | INTRAMUSCULAR | Status: AC
  Administered 2017-07-01: 1 mg via INTRAVENOUS
  Filled 2017-07-01: qty 1

## 2017-07-01 NOTE — H&P (Signed)
SURGICAL HISTORY & PHYSICAL (cpt [phone])  HISTORY OF PRESENT ILLNESS (HPI):  77 y.o. male presented to [facility] ED overnight for abdominal pain. Patient reports he ate more than he thinks he should have eaten 2 days ago, after which he felt bloated with mild abdominal pain, for which he presented to his primary care physician, who advised him that he seemed dehydrated. When patient left his primary care physician's office, he says his pain then worsened substantially, becoming severe with onset and worsening of nausea. Feeling similar to a prior episode of small bowel obstruction, he denies having passed flatus or BM yesterday as his symptoms were worsening, for which he presented to [facility] ED, where he experienced a single episode of non-bloody emesis. Following insertion of NG tube, patient states his abdomen feels "much better" with nearly resolved abdominal pain and resolved N/V. Patient was advised upon discharge after his previous SBO to follow-up with surgery at [facility], but he says he did not due to concern his insurance would not be accepted at [facility]. Patient otherwise denies fever/chills, CP, or SOB and says he is able to walk "more than a mile" without any CP or SOB and describes himself as in "good health".  PAST MEDICAL HISTORY (PMH):  Past Medical History:  Diagnosis Date  . Bowel obstruction (HCC)   . Hypertension   . Ventral hernia    had SBO in the hernia in 2017 and 2019    Reviewed. Otherwise negative.   PAST SURGICAL HISTORY (PSH):  Past Surgical History:  Procedure Laterality Date  . APPENDECTOMY    . HERNIA REPAIR     ventral hernia in [place] 35 yr ago  . TONSILLECTOMY      Reviewed. Otherwise negative.   MEDICATIONS:  Prior to Admission medications   Medication Sig Start Date End Date Taking? Authorizing Provider  aspirin EC 81 MG tablet Take 81 mg by mouth.   Yes [provider]  atenolol (TENORMIN) 50 MG tablet Take 50 mg by mouth 2 (two) times daily.   Yes  [provider]  atorvastatin (LIPITOR) 40 MG tablet Take 40 mg by mouth daily.   Yes [provider]  Melatonin 3 MG CAPS Take 1 capsule by mouth at bedtime as needed.   Yes [provider]  Multiple Vitamins-Minerals (CENTRUM SILVER 50+MEN PO) Take 1 tablet by mouth daily.   Yes [provider]  triamterene-hydrochlorothiazide (DYAZIDE) 37.5-25 MG capsule Take 1 capsule by mouth daily.   Yes [provider]     ALLERGIES:  No Known Allergies   SOCIAL HISTORY:  Social History   Socioeconomic History  . Marital status: Married    Spouse name: Not on file  . Number of children: Not on file  . Years of education: Not on file  . Highest education level: Not on file  Occupational History  . Not on file  Social Needs  . Financial resource strain: Not on file  . Food insecurity:    Worry: Not on file    Inability: Not on file  . Transportation needs:    Medical: Not on file    Non-medical: Not on file  Tobacco Use  . Smoking status: Never Smoker  . Smokeless tobacco: Never Used  Substance and Sexual Activity  . Alcohol use: No  . Drug use: No  . Sexual activity: Not on file  Lifestyle  . Physical activity:    Days per week: Not on file    Minutes per session:  Not on file  . Stress: Not on file  Relationships  . Social connections:    Talks on phone: Not on file    Gets together: Not on file    Attends religious service: Not on file    Active member of club or organization: Not on file    Attends meetings of clubs or organizations: Not on file    Relationship status: Not on file  . Intimate partner violence:    Fear of current or ex partner: Not on file    Emotionally abused: Not on file    Physically abused: Not on file    Forced sexual activity: Not on file  Other Topics Concern  . Not on file  Social History Narrative  . Not on file    The patient currently resides (home / rehab facility / nursing home): Home The  patient normally is (ambulatory / bedbound): Ambulatory  FAMILY HISTORY:  Family History  Problem Relation Age of Onset  . Hypertension Father     Otherwise negative.   REVIEW OF SYSTEMS:  Constitutional: denies any other weight loss, fever, chills, or sweats  Eyes: denies any other vision changes, history of eye injury  ENT: denies sore throat, hearing problems  Respiratory: denies shortness of breath, wheezing  Cardiovascular: denies chest pain, palpitations  Gastrointestinal: abdominal pain, N/V, and bowel function as per HPI  Genitourinary: denies burning with urination or urinary frequency Musculoskeletal: denies any other joint pains or cramps  Skin: Denies any other rashes or skin discolorations  Neurological: denies any other headache, dizziness, weakness  Psychiatric: denies any other depression, anxiety   All other review of systems were otherwise negative.  VITAL SIGNS:  Temp:  [97.6 F (36.4 C)-98.2 F (36.8 C)] 98 F (36.7 C) (05/29 0432) Pulse Rate:  [60-78] 78 (05/29 0432) Resp:  [16-20] 20 (05/29 0432) BP: (128-185)/(90-113) 128/90 (05/29 0432) SpO2:  [92 %-100 %] 98 % (05/29 0432) Weight:  [160 lb (72.6 kg)] 160 lb (72.6 kg) (05/28 1730)     Height:  (177.8 cm) Weight: 160 lb (72.6 kg) BMI (Calculated): 22.96   INTAKE/OUTPUT:  This shift: No intake/output data recorded.  Last 2 shifts: @  PHYSICAL EXAM:  Constitutional:  -- Normal body habitus  -- Awake, alert, and oriented x3, no apparent distress Eyes:  -- Pupils equally round and reactive to light  -- No scleral icterus, B/L no occular discharge Ear, nose, throat: -- Neck is FROM WNL -- No jugular venous distension  Pulmonary:  -- No wheezes or rhales -- Equal breath sounds bilaterally -- Breathing non-labored at rest Cardiovascular:  -- S1, S2 present  -- No pericardial rubs  Gastrointestinal:  -- Abdomen soft, nontender, and mildly distended with no guarding or rebound  tenderness -- Very large Right of midline ventral hernia containing palpably more dilated loops of small bowel -- No abdominal masses appreciated, pulsatile or otherwise Musculoskeletal and Integumentary:  -- Wounds or skin discoloration: None appreciated except well-healed prior surgical incisions -- Extremities: B/L UE and LE FROM, hands and feet warm, no edema  Neurologic:  -- Motor function: Intact and symmetric -- Sensation: Intact and symmetric Psychiatric:  -- Mood and affect WNL  Labs:  CBC Latest Ref Rng & Units 07/01/2017 06/30/2017 08/08/2015  WBC 3.8 - 10.6 K/uL 8.7 12.6(H) 7.7  Hemoglobin 13.0 - 18.0 g/dL 82.9 56.2 18.2(H)  Hematocrit 40.0 - 52.0 % 51.4 51.7 52.8(H)  Platelets 150 - 440 K/uL 239 257 157  CMP Latest Ref Rng & Units 07/01/2017 06/30/2017 08/08/2015  Glucose 65 - 99 mg/dL 295(A) 213(Y) 865(H)  BUN 6 - 20 mg/dL 84(O) 96(E) 95(M)  Creatinine 0.61 - 1.24 mg/dL 8.41 3.24 4.01(U)  Sodium 135 - 145 mmol/L 134(L) 135 141  Potassium 3.5 - 5.1 mmol/L 4.7 4.6 3.6  Chloride 101 - 111 mmol/L 98(L) 94(L) 92(L)  CO2 22 - 32 mmol/L 27 28 41(H)  Calcium 8.9 - 10.3 mg/dL 27.2 11.0(H) 10.1  Total Protein 6.5 - 8.1 g/dL 7.3 5.3(G) -  Total Bilirubin 0.3 - 1.2 mg/dL 1.2 6.4(Q) -  Alkaline Phos 38 - 126 U/L 28(L) 31(L) -  AST 15 - 41 U/L 38 44(H) -  ALT 17 - 63 U/L 31 35 -    Imaging studies:  CT Abdomen and Pelvis with Contrast (06/30/2017) - personally reviewed and discussed with patient Small bowel obstruction with transition involving the ileum, located within a chronic large complex ventral hernia, as described above. This appearance is similar to the prior from 2017.  No small bowel wall thickening or pneumatosis to suggest an at risk loop of bowel. Small volume pelvic ascites. No free air.  Cholelithiasis, without associated inflammatory changes.  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 Recommend followup by ultrasound in 3 years.   Assessment/Plan:  (ICD-10's: K61.52) 77 y.o. male with complete small bowel obstruction, likely attributable to post-surgical adhesions and/or large ventral abdominal wall hernia with significant loss of peritoneal domain, complicated by pertinent comorbidities including only HTN.  - NPO for now, IV fluids             - NG tube for nasogastric decompression             - monitor ongoing bowel function and abdominal exam              - anticipate symptomatic relief within 24 - 48 hours following NGT insertion, followed by "rumbling" the following day and flatus either the same day or the day following the "rumbling" with anticipated length of stay ~3 - 5 days with successful non-operative management for 8 of 10 patients with small bowel obstruction attributed to post-surgical adhesions  - if surgical intervention is required, will require transfer to tertiary care facility as previously advised             - medical management of comorbidities (anti-HTN for HTN)             - DVT prophylaxis, ambulation encouraged  All of the above findings and recommendations were discussed with the patient and his wife, and all of his and family's questions were answered to their expressed satisfaction.  -- Scherrie Gerlach Earlene Plater, MD, RPVI [place]: [facility] General Surgery - Partnering for exceptional care. Office: [phone]

## 2017-07-01 NOTE — Progress Notes (Signed)
Patient has spiritual support from his pastor.  Patient is also surrounded by the support of his wife and daughter-in-law. Chaplain made introductions and offered to provide pastoral care as requested.

## 2017-07-02 MED ORDER — MELATONIN 5 MG PO TABS
5.0000 mg | ORAL_TABLET | Freq: Every evening | ORAL | Status: DC | PRN
  Administered 2017-07-02: 5 mg via ORAL
  Filled 2017-07-02: qty 1

## 2017-07-02 MED ORDER — NON FORMULARY: 5.0000 mg | Freq: Every evening | Status: DC | PRN

## 2017-07-02 MED ORDER — MENTHOL 3 MG MT LOZG
1.0000 | LOZENGE | OROMUCOSAL | Status: DC | PRN
  Administered 2017-07-03: 3 mg via ORAL
  Filled 2017-07-02: qty 9

## 2017-07-02 NOTE — Progress Notes (Signed)
Pts BP 141/104, contacted Dr. Earlene Plater for possible treatment. No new orders for BP at this time.

## 2017-07-02 NOTE — Progress Notes (Signed)
SURGICAL PROGRESS NOTE (cpt [phone])  Hospital Day(s): 2.   Post op day(s):  Marland Kitchen   Interval History: Patient seen and examined, no acute events or new complaints overnight. Patient reports he feels much better today with complete resolution of his recent abdominal pain and N/V, describes "rumbling", but so far denies flatus or BM, says he would like to and plans to walk today.  Review of Systems:  Constitutional: denies fever, chills  HEENT: denies cough or congestion  Respiratory: denies any shortness of breath  Cardiovascular: denies chest pain or palpitations  Gastrointestinal: abdominal pain, N/V, and bowel function as per interval history Genitourinary: denies burning with urination or urinary frequency Musculoskeletal: denies pain, decreased motor or sensation Integumentary: denies any other rashes or skin discolorations Neurological: denies HA or vision/hearing changes   Vital signs in last 24 hours: [min-max] current  Temp:  [97.4 F (36.3 C)-98.1 F (36.7 C)] 97.4 F (36.3 C) (05/30 0559) Pulse Rate:  [64-67] 67 (05/30 0559) Resp:  [14-16] 16 (05/30 0559) BP: (138-166)/(78-88) 166/84 (05/30 0559) SpO2:  [94 %-96 %] 96 % (05/30 0559)     Height:  (177.8 cm) Weight: 160 lb (72.6 kg) BMI (Calculated): 22.96   Intake/Output this shift:  No intake/output data recorded.   Intake/Output last 2 shifts:  @   Physical Exam:  Constitutional: alert, cooperative and no distress  HENT: normocephalic without obvious abnormality  Eyes: PERRL, EOM's grossly intact and symmetric  Neuro: CN II - XII grossly intact and symmetric without deficit  Respiratory: breathing non-labored at rest  Cardiovascular: regular rate and sinus rhythm  Gastrointestinal: soft and non-tender with softer large and still somewhat firm and distended Right-sided ventral abdominal wall hernia with palpable and audible peristalsis Musculoskeletal: UE and LE FROM, no edema or wounds, motor  and sensation grossly intact, NT   Labs:  CBC Latest Ref Rng & Units 07/01/2017 06/30/2017 08/08/2015  WBC 3.8 - 10.6 K/uL 8.7 12.6(H) 7.7  Hemoglobin 13.0 - 18.0 g/dL 87.5 64.3 18.2(H)  Hematocrit 40.0 - 52.0 % 51.4 51.7 52.8(H)  Platelets 150 - 440 K/uL 239 257 157   CMP Latest Ref Rng & Units 07/01/2017 06/30/2017 08/08/2015  Glucose 65 - 99 mg/dL 329(J) 188(C) 166(A)  BUN 6 - 20 mg/dL 63(K) 16(W) 10(X)  Creatinine 0.61 - 1.24 mg/dL 3.23 5.57 3.22(G)  Sodium 135 - 145 mmol/L 134(L) 135 141  Potassium 3.5 - 5.1 mmol/L 4.7 4.6 3.6  Chloride 101 - 111 mmol/L 98(L) 94(L) 92(L)  CO2 22 - 32 mmol/L 27 28 41(H)  Calcium 8.9 - 10.3 mg/dL 25.4 11.0(H) 10.1  Total Protein 6.5 - 8.1 g/dL 7.3 2.7(C) -  Total Bilirubin 0.3 - 1.2 mg/dL 1.2 6.2(B) -  Alkaline Phos 38 - 126 U/L 28(L) 31(L) -  AST 15 - 41 U/L 38 44(H) -  ALT 17 - 63 U/L 31 35 -    Assessment/Plan: (ICD-10's: K56.52) 77 y.o. malewith complete small bowel obstruction, likely attributable to post-surgical adhesions and/or large ventral abdominal wall hernia with significant loss of peritoneal domain, complicated by pertinent comorbidities including only HTN.  - NPO for now, IV fluids - NG tube for nasogastric decompression - monitor ongoing bowel function and abdominal exam  - anticipate symptomatic relief within 24 - 48 hours following NGT insertion, followed by "rumbling" the following day and flatus either the same day or the day following the "rumbling" with anticipated length of stay ~3 - 5 days with successful non-operative management for 8 of  10 patients with small bowel obstruction attributed to post-surgical adhesions             - if surgical intervention is required, will require transfer to tertiary care facility as previously advised - medical management of comorbidities (anti-HTN for HTN) - DVT prophylaxis, ambulation encouraged  All of the above  findings and recommendations were discussed with the patient and his wife, and all of his and family's questions were answered to their expressed satisfaction.  -- Scherrie Gerlach Earlene Plater, MD, RPVI [place]: [facility] General Surgery - Partnering for exceptional care. Office: [phone]

## 2017-07-03 DIAGNOSIS — K5652 Intestinal adhesions [bands] with complete obstruction: Secondary | ICD-10-CM

## 2017-07-03 NOTE — Care Management Important Message (Signed)
Copy of signed IM left in patient's room.    

## 2017-07-03 NOTE — Progress Notes (Signed)
SURGICAL PROGRESS NOTE    Bryan Savage  MWU:[id number] DOB: 27-Jun-1940 DOA: 06/30/2017  History: The patient is a 77 year-old male who was admitted to the hospital 3 days ago with small bowel obstruction likely related to post-surgical adhesions and/or large ventral hernia with loss of domain. He has been treated nonoperatively with NGT and bowel rest and has been improving.  Subjective: The patient states that he feels greatly improved since admission. He notes passing a large amount of flatus several times today. He denies BM. He denies nausea and vomiting. He denies fever and chills. He denies chest pain and shortness of breath.  Objective: Vitals:   07/02/17 2100 07/02/17 2122 07/03/17 0541 07/03/17 1100  BP:  (!) 141/102 (!) 133/92 (!) 134/92  Pulse:  (!) 106 (!) 106 (!) 104  Resp: 18 18 18 19   Temp:  98.2 F (36.8 C) 97.8 F (36.6 C) 97.9 F (36.6 C)  TempSrc:  Oral Oral Oral  SpO2:  92% 93% 96%  Weight:      Height:        Examination:  General exam: Appears calm and comfortable  Respiratory system: Respiratory effort normal. Cardiovascular system: RRR.  Gastrointestinal system: Abdomen is soft and nontender. There is a large ventral hernia or likely multiple ventral hernias appreciated.  Central nervous system: Alert and oriented. No focal neurological deficits. Extremities: Symmetric 5 x 5 power. Skin: No rashes, lesions or ulcers Psychiatry: Judgement and insight appear normal. Mood & affect appropriate.    Scheduled Meds: . enoxaparin (LOVENOX) injection  40 mg Subcutaneous Q24H   Continuous Infusions: . famotidine (PEPCID) IV Stopped (07/03/17 1152)  . lactated ringers 125 mL/hr at 07/03/17 1542    ASSESSMENT/PLAN: The patient is a 77 year-old male who was admitted with small bowel obstruction likely related to post-surgical adhesions and/or large ventral hernia with loss of domain. He is improving with nonoperative treatment consisting of NGT and bowel  rest. He passed a large amount of flatus today per the patient.  - Clamp NGT, patient may have sips of clears while NGT is clamped - Monitor ongoing bowel function and abdominal exam - If surgical intervention is required, will require transfer to tertiary care facility - Medical management of comorbidities - DVT prophylaxis, encourage ambulation   Bryan Savage  Bryan Lubas, DO

## 2017-07-04 MED ORDER — ATORVASTATIN CALCIUM 20 MG PO TABS
40.0000 mg | ORAL_TABLET | Freq: Every day | ORAL | Status: DC
  Administered 2017-07-04: 40 mg via ORAL
  Filled 2017-07-04: qty 2

## 2017-07-04 MED ORDER — ATENOLOL 50 MG PO TABS
50.0000 mg | ORAL_TABLET | Freq: Two times a day (BID) | ORAL | Status: DC
  Administered 2017-07-04: 50 mg via ORAL
  Filled 2017-07-04: qty 1

## 2017-07-04 MED ORDER — ASPIRIN EC 81 MG PO TBEC
81.0000 mg | DELAYED_RELEASE_TABLET | Freq: Every day | ORAL | Status: DC
  Administered 2017-07-04: 81 mg via ORAL
  Filled 2017-07-04: qty 1

## 2017-07-04 MED ORDER — TRIAMTERENE-HCTZ 37.5-25 MG PO TABS
1.0000 | ORAL_TABLET | Freq: Every day | ORAL | Status: DC
  Administered 2017-07-04: 1 via ORAL
  Filled 2017-07-04: qty 1

## 2017-07-04 NOTE — Progress Notes (Signed)
Patient has had NGT clamped all night, no complaints of nausea or pain. Abdomen still slightly distended but nontender. Patient ambulated  3 laps this morning with no difficulty. Anselm Junglingonyers,Wayde Gopaul M

## 2017-07-04 NOTE — Progress Notes (Signed)
SURGICAL PROGRESS NOTE (cpt [id number])  Hospital Day(s): 4.   Post op day(s):  Marland Kitchen.   Interval History: Patient seen and examined, no acute events or new complaints overnight. Patient reports ongoing +flatus and +BM since yesterday, tolerating NG tube clamped (off suction) since yesterday, has been ambulating in the halls without difficulty, denies abdominal pain, N/V, abdominal distention, fever/chills, CP, or SOB.  Review of Systems:  Constitutional: denies fever, chills  HEENT: denies cough or congestion  Respiratory: denies any shortness of breath  Cardiovascular: denies chest pain or palpitations  Gastrointestinal: abdominal pain, N/V, and bowel function as per interval history Genitourinary: denies burning with urination or urinary frequency Musculoskeletal: denies pain, decreased motor or sensation Integumentary: denies any other rashes or skin discolorations Neurological: denies HA or vision/hearing changes   Vital signs in last 24 hours: [min-max] current  Temp:  [97.5 F (36.4 C)-98.5 F (36.9 C)] 97.5 F (36.4 C) (06/01 0438) Pulse Rate:  [54-104] 54 (06/01 0438) Resp:  [19-20] 20 (06/01 0438) BP: (134-157)/(88-99) 157/88 (06/01 0438) SpO2:  [96 %-97 %] 97 % (06/01 0438)     Height: 5\' 10"  (177.8 cm) Weight: 160 lb (72.6 kg) BMI (Calculated): 22.96   Intake/Output this shift:  Total I/O In: 375.1 [I.V.:375.1] Out: -    Intake/Output last 2 shifts:  @IOLAST2SHIFTS @   Physical Exam:  Constitutional: alert, cooperative and no distress  HENT: normocephalic without obvious abnormality  Eyes: PERRL, EOM's grossly intact and symmetric  Neuro: CN II - XII grossly intact and symmetric without deficit  Respiratory: breathing non-labored at rest  Cardiovascular: regular rate and sinus rhythm  Gastrointestinal: soft, completely non-tender, and large chronic Right abdominal ventral hernia soft and non-distended Musculoskeletal: UE and LE FROM, no edema or wounds, motor and  sensation grossly intact, NT   Labs:  CBC Latest Ref Rng & Units 07/01/2017 06/30/2017 08/08/2015  WBC 3.8 - 10.6 K/uL 8.7 12.6(H) 7.7  Hemoglobin 13.0 - 18.0 g/dL 28.417.5 13.217.6 18.2(H)  Hematocrit 40.0 - 52.0 % 51.4 51.7 52.8(H)  Platelets 150 - 440 K/uL 239 257 157   CMP Latest Ref Rng & Units 07/01/2017 06/30/2017 08/08/2015  Glucose 65 - 99 mg/dL 440(N127(H) 027(O123(H) 536(U160(H)  BUN 6 - 20 mg/dL 44(I31(H) 34(V33(H) 42(V30(H)  Creatinine 0.61 - 1.24 mg/dL 9.561.11 3.871.11 5.64(P1.46(H)  Sodium 135 - 145 mmol/L 134(L) 135 141  Potassium 3.5 - 5.1 mmol/L 4.7 4.6 3.6  Chloride 101 - 111 mmol/L 98(L) 94(L) 92(L)  CO2 22 - 32 mmol/L 27 28 41(H)  Calcium 8.9 - 10.3 mg/dL 32.910.0 11.0(H) 10.1  Total Protein 6.5 - 8.1 g/dL 7.3 5.1(O8.3(H) -  Total Bilirubin 0.3 - 1.2 mg/dL 1.2 8.4(Z1.3(H) -  Alkaline Phos 38 - 126 U/L 28(L) 31(L) -  AST 15 - 41 U/L 38 44(H) -  ALT 17 - 63 U/L 31 35 -    Assessment/Plan: (ICD-10's: K56.52) 77 y.o.malewith complete small bowel obstruction, likely attributable to post-surgical adhesionsand/or large ventral abdominal wall hernia with significant loss of peritoneal domain, complicated by pertinent comorbidities includingonly HTN.  - NG tube removed uneventfully - clear liquids diet ordered with advance as tolerated - monitor ongoing bowel function and abdominal exam  - advised plan was to monitor exam and advancement of diet with anticipated discharge home tomorrow, but patient and his family strongly request discharge this evening if patient tolerates advancement of his diet without ANY difficulties (pain, N/V, distention, etc) so he can attend eldest grandson's graduation early tomorrow [place] - recommend  outpatient elective hernia repair at tertiary care facility as previously advised - medical managementof comorbidities (anti-HTN for HTN) -DVT prophylaxis,ambulation encouraged  - discharge planning  All of the  above findings and recommendations were discussed with the patient and hiswife, and all of his and family's questions were answered to their expressed satisfaction.  -- Scherrie Gerlach Earlene Plater, MD, RPVI [place]: [facility] General Surgery - Partnering for exceptional care. Office: [phone]

## 2017-07-08 NOTE — Discharge Summary (Signed)
Physician Discharge Summary  Patient ID: Bryan Savage MRN: [id number] DOB/AGE: Aug 17, 1940 77 y.o.  Admit date: 06/30/2017 Discharge date: 07/04/2017  Admission Diagnoses:  Discharge Diagnoses:  Active Problems:   SBO (small bowel obstruction) (HCC)   Discharged Condition: good  Hospital Course: 77 y.o. male presented [facility] ED for abdominal pain. Workup was found to be significant for CT imaging demonstrating SBO. NG tube was inserted, and patient's pain and nausea both resolved, followed by subsequent +flatus and +BM's. Patient's NG tube was accordingly removed, and advancement of patient's diet was well-tolerated. The remainder of patient's hospital course was essentially unremarkable, and discharge planning was initiated accordingly with patient safely able to be discharged home with appropriate discharge instructions after all of his questions were answered to his expressed satisfaction.  Consults: None  Significant Diagnostic Studies: radiology: CT scan: SBO  Treatments: procedures: nasogastric decompression  Discharge Exam: Blood pressure (!) 154/107, pulse 93, temperature 97.9 F (36.6 C), temperature source Oral, resp. rate 17, height 5\' 10"  (1.778 m), weight 160 lb (72.6 kg), SpO2 93 %. General appearance: alert, cooperative and no distress GI: soft, non-tender; bowel sounds normal; no masses,  no organomegaly  Disposition:    Allergies as of 07/04/2017   No Known Allergies     Medication List    TAKE these medications   aspirin EC 81 MG tablet Take 81 mg by mouth.   atenolol 50 MG tablet Commonly known as:  TENORMIN Take 50 mg by mouth 2 (two) times daily.   atorvastatin 40 MG tablet Commonly known as:  LIPITOR Take 40 mg by mouth daily.   CENTRUM SILVER 50+MEN PO Take 1 tablet by mouth daily.   Melatonin 3 MG Caps Take 1 capsule by mouth at bedtime as needed.   triamterene-hydrochlorothiazide 37.5-25 MG capsule Commonly known as:  DYAZIDE Take 1  capsule by mouth daily.      Follow-up Information    Ancil Linseyavis, Deniss Wormley Evan, MD. Call.   Specialty:  General Surgery Why:  You do not need to schedule surgical follow-up with our office, but feel free to call if any questions or concerns or if you need a referral to [facility], [facility], or otherwise for elective repair of your large and at least twice problematic ventral hernia. Contact information: [address] [place] [address] [phone]           Signed: Ancil LinseyJason Evan Earlyn Sylvan 07/08/2017, 7:45 AM

## 2019-08-17 ENCOUNTER — Other Ambulatory Visit: Payer: Self-pay

## 2019-08-17 ENCOUNTER — Encounter: Payer: Self-pay | Admitting: Ophthalmology

## 2019-08-22 NOTE — Discharge Instructions (Signed)

## 2019-08-24 ENCOUNTER — Encounter: Payer: Self-pay | Admitting: Ophthalmology

## 2019-08-24 ENCOUNTER — Ambulatory Visit
Admission: AD | Admit: 2019-08-24 | Discharge: 2019-08-24 | Disposition: A | Discharge status: Home / Self Care | Payer: Medicare HMO | Attending: Ophthalmology | Admitting: Ophthalmology

## 2019-08-24 ENCOUNTER — Other Ambulatory Visit: Payer: Self-pay

## 2019-08-24 ENCOUNTER — Ambulatory Visit: Payer: Medicare HMO | Admitting: Anesthesiology

## 2019-08-24 ENCOUNTER — Encounter
Admission: AD | Disposition: A | Discharge status: Home / Self Care | Payer: Self-pay | Source: Home / Self Care | Attending: Ophthalmology

## 2019-08-24 DIAGNOSIS — H2511 Age-related nuclear cataract, right eye: Secondary | ICD-10-CM | POA: Diagnosis present

## 2019-08-24 DIAGNOSIS — Z8673 Personal history of transient ischemic attack (TIA), and cerebral infarction without residual deficits: Secondary | ICD-10-CM | POA: Diagnosis not present

## 2019-08-24 DIAGNOSIS — Z7982 Long term (current) use of aspirin: Secondary | ICD-10-CM | POA: Insufficient documentation

## 2019-08-24 DIAGNOSIS — I10 Essential (primary) hypertension: Secondary | ICD-10-CM | POA: Insufficient documentation

## 2019-08-24 DIAGNOSIS — Z7902 Long term (current) use of antithrombotics/antiplatelets: Secondary | ICD-10-CM | POA: Diagnosis not present

## 2019-08-24 DIAGNOSIS — Z79899 Other long term (current) drug therapy: Secondary | ICD-10-CM | POA: Diagnosis not present

## 2019-08-24 DIAGNOSIS — E7800 Pure hypercholesterolemia, unspecified: Secondary | ICD-10-CM | POA: Diagnosis not present

## 2019-08-24 HISTORY — PX: CATARACT EXTRACTION W/PHACO: SHX586

## 2019-08-24 SURGERY — PHACOEMULSIFICATION, CATARACT, WITH IOL INSERTION
Anesthesia: Monitor Anesthesia Care | Site: Eye | Laterality: Right

## 2019-08-24 MED ORDER — ARMC OPHTHALMIC DILATING DROPS
1.0000 | OPHTHALMIC | Status: DC | PRN
Start: 2019-08-24 — End: 2019-08-24
  Administered 2019-08-24 (×3): 1 via OPHTHALMIC

## 2019-08-24 MED ORDER — LACTATED RINGERS IV SOLN: INTRAVENOUS | Status: DC

## 2019-08-24 MED ORDER — ACETAMINOPHEN 325 MG PO TABS: 325.0000 mg | ORAL_TABLET | ORAL | Status: DC | PRN

## 2019-08-24 MED ORDER — EPINEPHRINE PF 1 MG/ML IJ SOLN
INTRAOCULAR | Status: DC | PRN
  Administered 2019-08-24: 60 mL via OPHTHALMIC

## 2019-08-24 MED ORDER — FENTANYL CITRATE (PF) 100 MCG/2ML IJ SOLN
INTRAMUSCULAR | Status: DC | PRN
  Administered 2019-08-24: 50 ug via INTRAVENOUS

## 2019-08-24 MED ORDER — LIDOCAINE HCL (PF) 2 % IJ SOLN
INTRAOCULAR | Status: DC | PRN
  Administered 2019-08-24: 1 mL

## 2019-08-24 MED ORDER — ACETAMINOPHEN 160 MG/5ML PO SOLN: 325.0000 mg | ORAL | Status: DC | PRN

## 2019-08-24 MED ORDER — TETRACAINE HCL 0.5 % OP SOLN
1.0000 [drp] | OPHTHALMIC | Status: DC | PRN
Start: 2019-08-24 — End: 2019-08-24
  Administered 2019-08-24 (×3): 1 [drp] via OPHTHALMIC

## 2019-08-24 MED ORDER — BRIMONIDINE TARTRATE-TIMOLOL 0.2-0.5 % OP SOLN
OPHTHALMIC | Status: DC | PRN
  Administered 2019-08-24: 1 [drp] via OPHTHALMIC

## 2019-08-24 MED ORDER — CEFUROXIME OPHTHALMIC INJECTION 1 MG/0.1 ML
INJECTION | OPHTHALMIC | Status: DC | PRN
  Administered 2019-08-24: 0.1 mL via INTRACAMERAL

## 2019-08-24 MED ORDER — MIDAZOLAM HCL 2 MG/2ML IJ SOLN
INTRAMUSCULAR | Status: DC | PRN
  Administered 2019-08-24 (×2): 1 mg via INTRAVENOUS

## 2019-08-24 MED ORDER — NA HYALUR & NA CHOND-NA HYALUR 0.4-0.35 ML IO KIT
PACK | INTRAOCULAR | Status: DC | PRN
  Administered 2019-08-24: 1 mL via INTRAOCULAR

## 2019-08-24 MED ORDER — MOXIFLOXACIN HCL 0.5 % OP SOLN
1.0000 [drp] | OPHTHALMIC | Status: DC | PRN
  Administered 2019-08-24 (×3): 1 [drp] via OPHTHALMIC

## 2019-08-24 SURGICAL SUPPLY — 30 items
CANNULA ANT/CHMB 27G (MISCELLANEOUS) ×1 IMPLANT
CANNULA ANT/CHMB 27GA (MISCELLANEOUS) ×3 IMPLANT
GLOVE SURG LX 7.5 STRW (GLOVE) ×4
GLOVE SURG LX STRL 7.5 STRW (GLOVE) ×1 IMPLANT
GLOVE SURG TRIUMPH 8.0 PF LTX (GLOVE) ×3 IMPLANT
GOWN STRL REUS W/ TWL LRG LVL3 (GOWN DISPOSABLE) ×2 IMPLANT
GOWN STRL REUS W/TWL LRG LVL3 (GOWN DISPOSABLE) ×6
LENS IOL ACRSF IQ TRC 3 21.5 IMPLANT
LENS IOL ACRYSOF IQ TORIC 21.5 ×3 IMPLANT
LENS IOL IQ TORIC 3 21.5 ×1 IMPLANT
MARKER SKIN DUAL TIP RULER LAB (MISCELLANEOUS) ×3 IMPLANT
NDL CAPSULORHEX 25GA (NEEDLE) ×1 IMPLANT
NDL FILTER BLUNT 18X1 1/2 (NEEDLE) ×2 IMPLANT
NDL RETROBULBAR .5 NSTRL (NEEDLE) IMPLANT
NEEDLE CAPSULORHEX 25GA (NEEDLE) ×3 IMPLANT
NEEDLE FILTER BLUNT 18X 1/2SAF (NEEDLE) ×4
NEEDLE FILTER BLUNT 18X1 1/2 (NEEDLE) ×2 IMPLANT
PACK CATARACT BRASINGTON (MISCELLANEOUS) ×3 IMPLANT
PACK EYE AFTER SURG (MISCELLANEOUS) ×3 IMPLANT
PACK OPTHALMIC (MISCELLANEOUS) ×3 IMPLANT
RING MALYGIN 7.0 (MISCELLANEOUS) IMPLANT
SOLUTION OPHTHALMIC SALT (MISCELLANEOUS) ×3 IMPLANT
SUT ETHILON 10-0 CS-B-6CS-B-6 (SUTURE)
SUT VICRYL  9 0 (SUTURE)
SUT VICRYL 9 0 (SUTURE) IMPLANT
SUTURE EHLN 10-0 CS-B-6CS-B-6 (SUTURE) IMPLANT
SYR 3ML LL SCALE MARK (SYRINGE) ×6 IMPLANT
SYR TB 1ML LUER SLIP (SYRINGE) ×3 IMPLANT
WATER STERILE IRR 250ML POUR (IV SOLUTION) ×3 IMPLANT
WIPE NON LINTING 3.25X3.25 (MISCELLANEOUS) ×3 IMPLANT

## 2019-08-24 NOTE — Op Note (Signed)
LOCATION:  [facility]   PREOPERATIVE DIAGNOSIS:  Nuclear sclerotic cataract of the right eye.  H25.11   POSTOPERATIVE DIAGNOSIS:  Nuclear sclerotic cataract of the right eye.   PROCEDURE:  Phacoemulsification with Toric posterior chamber intraocular lens placement of the right eye.  Ultrasound time: Procedure(s) with comments: CATARACT EXTRACTION PHACO AND INTRAOCULAR LENS PLACEMENT (IOC) RIGHT toric lens (Right) - 5.79 0:56.9 10.2%  LENS:   Implant Name Type Inv. Item Serial No. Manufacturer Lot No. LRB No. Used Action  ACRYSOF IQ TORIC IOL Intraocular Lens  [id number] ALCON  Right 1 Implanted     [id number] 21.5 D Toric intraocular lens with 1.5 diopters of cylindrical power with axis orientation at 7 degrees.    SURGEON:  Deirdre Evener, MD   ANESTHESIA: Topical with tetracaine drops and 2% Xylocaine jelly, augmented with 1% preservative-free intracameral lidocaine. .   COMPLICATIONS:  None.   DESCRIPTION OF PROCEDURE:  The patient was identified in the holding room and transported to the operating suite and placed in the supine position under the operating microscope.  The right eye was identified as the operative eye, and it was prepped and draped in the usual sterile ophthalmic fashion.    A clear-corneal paracentesis incision was made at the 12:00 position.  0.5 ml of preservative-free 1% lidocaine was injected into the anterior chamber. The anterior chamber was filled with Viscoat.  A 2.4 millimeter near clear corneal incision was then made at the 9:00 position.  A cystotome and capsulorrhexis forceps were then used to make a curvilinear capsulorrhexis.  Hydrodissection and hydrodelineation were then performed using balanced salt solution.   Phacoemulsification was then used in stop and chop fashion to remove the lens, nucleus and epinucleus.  The remaining cortex was aspirated using the irrigation and aspiration handpiece.  Provisc viscoelastic was then placed  into the capsular bag to distend it for lens placement.  The Verion digital marker was used to align the implant at the intended axis.   A Toric lens was then injected into the capsular bag.  It was rotated clockwise until the axis marks on the lens were approximately 15 degrees in the counterclockwise direction to the intended alignment.  The viscoelastic was aspirated from the eye using the irrigation aspiration handpiece.  Then, a Koch spatula through the sideport incision was used to rotate the lens in a clockwise direction until the axis markings of the intraocular lens were lined up with the Verion alignment.  Balanced salt solution was then used to hydrate the wounds. .ccbef    The eye was noted to have a physiologic pressure and there was no wound leak noted.   Timolol and Brimonidine drops were applied to the eye.  The patient was taken to the recovery room in stable condition having had no complications of anesthesia or surgery.  Chalsey Leeth 08/24/2019, 10:31 AM

## 2019-08-24 NOTE — Anesthesia Postprocedure Evaluation (Signed)
Anesthesia Post Note  Patient: Bryan Savage  Procedure(s) Performed: CATARACT EXTRACTION PHACO AND INTRAOCULAR LENS PLACEMENT (IOC) RIGHT toric lens (Right Eye)     Patient location during evaluation: PACU Anesthesia Type: MAC Level of consciousness: awake and alert Pain management: pain level controlled Vital Signs Assessment: post-procedure vital signs reviewed and stable Respiratory status: spontaneous breathing, nonlabored ventilation, respiratory function stable and patient connected to nasal cannula oxygen Cardiovascular status: stable and blood pressure returned to baseline Postop Assessment: no apparent nausea or vomiting Anesthetic complications: no   No complications documented.  Trecia Rogers

## 2019-08-24 NOTE — Transfer of Care (Signed)
Immediate Anesthesia Transfer of Care Note  Patient: Bryan Savage  Procedure(s) Performed: CATARACT EXTRACTION PHACO AND INTRAOCULAR LENS PLACEMENT (IOC) RIGHT toric lens (Right Eye)  Patient Location: PACU  Anesthesia Type: MAC  Level of Consciousness: awake, alert  and patient cooperative  Airway and Oxygen Therapy: Patient Spontanous Breathing and Patient connected to supplemental oxygen  Post-op Assessment: Post-op Vital signs reviewed, Patient's Cardiovascular Status Stable, Respiratory Function Stable, Patent Airway and No signs of Nausea or vomiting  Post-op Vital Signs: Reviewed and stable  Complications: No complications documented.

## 2019-08-24 NOTE — H&P (Signed)

## 2019-08-24 NOTE — Anesthesia Preprocedure Evaluation (Signed)
Anesthesia Evaluation  Patient identified by MRN, date of birth, ID band Patient awake    Reviewed: Allergy & Precautions, H&P , NPO status , Patient's Chart, lab work & pertinent test results, reviewed documented beta blocker date and time   Airway Mallampati: II  TM Distance: >3 FB Neck ROM: full    Dental no notable dental hx.    Pulmonary neg pulmonary ROS,    Pulmonary exam normal breath sounds clear to auscultation       Cardiovascular Exercise Tolerance: Good hypertension, Normal cardiovascular exam Rhythm:regular Rate:Normal     Neuro/Psych negative neurological ROS  negative psych ROS   GI/Hepatic negative GI ROS, Neg liver ROS,   Endo/Other  negative endocrine ROS  Renal/GU negative Renal ROS  negative genitourinary   Musculoskeletal   Abdominal   Peds  Hematology negative hematology ROS (+)   Anesthesia Other Findings   Reproductive/Obstetrics negative OB ROS                             Anesthesia Physical Anesthesia Plan  ASA: II  Anesthesia Plan: MAC   Post-op Pain Management:    Induction:   PONV Risk Score and Plan:   Airway Management Planned:   Additional Equipment:   Intra-op Plan:   Post-operative Plan:   Informed Consent: I have reviewed the patients History and Physical, chart, labs and discussed the procedure including the risks, benefits and alternatives for the proposed anesthesia with the patient or authorized representative who has indicated his/her understanding and acceptance.     Dental Advisory Given  Plan Discussed with: CRNA  Anesthesia Plan Comments:         Anesthesia Quick Evaluation

## 2019-08-24 NOTE — Anesthesia Procedure Notes (Signed)
Procedure Name: MAC Performed by: Izetta Dakin, CRNA Pre-anesthesia Checklist: Patient identified, Emergency Drugs available, Suction available, Timeout performed and Patient being monitored Patient Re-evaluated:Patient Re-evaluated prior to induction Oxygen Delivery Method: Nasal cannula

## 2019-08-25 ENCOUNTER — Encounter: Payer: Self-pay | Admitting: Ophthalmology

## 2019-09-14 ENCOUNTER — Encounter: Payer: Self-pay | Admitting: Ophthalmology

## 2019-09-19 ENCOUNTER — Other Ambulatory Visit: Payer: Self-pay

## 2019-09-19 ENCOUNTER — Other Ambulatory Visit
Admission: RE | Admit: 2019-09-19 | Discharge: 2019-09-19 | Disposition: A | Discharge status: Home / Self Care | Payer: Medicare HMO | Source: Ambulatory Visit | Attending: Ophthalmology | Admitting: Ophthalmology

## 2019-09-19 DIAGNOSIS — Z01812 Encounter for preprocedural laboratory examination: Secondary | ICD-10-CM | POA: Insufficient documentation

## 2019-09-19 DIAGNOSIS — Z20822 Contact with and (suspected) exposure to covid-19: Secondary | ICD-10-CM | POA: Diagnosis not present

## 2019-09-19 LAB — SARS CORONAVIRUS 2 (TAT 6-24 HRS): SARS Coronavirus 2: NEGATIVE

## 2019-09-19 NOTE — Discharge Instructions (Signed)

## 2019-09-21 ENCOUNTER — Encounter: Payer: Self-pay | Admitting: Ophthalmology

## 2019-09-21 ENCOUNTER — Encounter
Admission: RE | Disposition: A | Discharge status: Home / Self Care | Payer: Self-pay | Source: Home / Self Care | Attending: Ophthalmology

## 2019-09-21 ENCOUNTER — Ambulatory Visit: Payer: Medicare HMO | Admitting: Anesthesiology

## 2019-09-21 ENCOUNTER — Other Ambulatory Visit: Payer: Self-pay

## 2019-09-21 ENCOUNTER — Ambulatory Visit
Admission: RE | Admit: 2019-09-21 | Discharge: 2019-09-21 | Disposition: A | Discharge status: Home / Self Care | Payer: Medicare HMO | Attending: Ophthalmology | Admitting: Ophthalmology

## 2019-09-21 DIAGNOSIS — Z79899 Other long term (current) drug therapy: Secondary | ICD-10-CM | POA: Insufficient documentation

## 2019-09-21 DIAGNOSIS — E7800 Pure hypercholesterolemia, unspecified: Secondary | ICD-10-CM | POA: Diagnosis not present

## 2019-09-21 DIAGNOSIS — Z7902 Long term (current) use of antithrombotics/antiplatelets: Secondary | ICD-10-CM | POA: Diagnosis not present

## 2019-09-21 DIAGNOSIS — Z9841 Cataract extraction status, right eye: Secondary | ICD-10-CM | POA: Diagnosis not present

## 2019-09-21 DIAGNOSIS — Z7982 Long term (current) use of aspirin: Secondary | ICD-10-CM | POA: Diagnosis not present

## 2019-09-21 DIAGNOSIS — I10 Essential (primary) hypertension: Secondary | ICD-10-CM | POA: Diagnosis not present

## 2019-09-21 DIAGNOSIS — H2512 Age-related nuclear cataract, left eye: Secondary | ICD-10-CM | POA: Insufficient documentation

## 2019-09-21 DIAGNOSIS — Z8673 Personal history of transient ischemic attack (TIA), and cerebral infarction without residual deficits: Secondary | ICD-10-CM | POA: Insufficient documentation

## 2019-09-21 HISTORY — PX: CATARACT EXTRACTION W/PHACO: SHX586

## 2019-09-21 SURGERY — PHACOEMULSIFICATION, CATARACT, WITH IOL INSERTION
Anesthesia: Monitor Anesthesia Care | Site: Eye | Laterality: Left

## 2019-09-21 MED ORDER — LIDOCAINE HCL (PF) 2 % IJ SOLN
INTRAOCULAR | Status: DC | PRN
  Administered 2019-09-21: 1 mL

## 2019-09-21 MED ORDER — BRIMONIDINE TARTRATE-TIMOLOL 0.2-0.5 % OP SOLN
OPHTHALMIC | Status: DC | PRN
  Administered 2019-09-21: 1 [drp] via OPHTHALMIC

## 2019-09-21 MED ORDER — TETRACAINE HCL 0.5 % OP SOLN
1.0000 [drp] | OPHTHALMIC | Status: DC | PRN
  Administered 2019-09-21 (×3): 1 [drp] via OPHTHALMIC

## 2019-09-21 MED ORDER — MOXIFLOXACIN HCL 0.5 % OP SOLN
1.0000 [drp] | OPHTHALMIC | Status: DC | PRN
  Administered 2019-09-21 (×3): 1 [drp] via OPHTHALMIC

## 2019-09-21 MED ORDER — MIDAZOLAM HCL 2 MG/2ML IJ SOLN
INTRAMUSCULAR | Status: DC | PRN
  Administered 2019-09-21: 2 mg via INTRAVENOUS

## 2019-09-21 MED ORDER — CEFUROXIME OPHTHALMIC INJECTION 1 MG/0.1 ML
INJECTION | OPHTHALMIC | Status: DC | PRN
  Administered 2019-09-21: 0.1 mL via INTRACAMERAL

## 2019-09-21 MED ORDER — NA HYALUR & NA CHOND-NA HYALUR 0.4-0.35 ML IO KIT
PACK | INTRAOCULAR | Status: DC | PRN
  Administered 2019-09-21: 1 mL via INTRAOCULAR

## 2019-09-21 MED ORDER — LACTATED RINGERS IV SOLN: INTRAVENOUS | Status: DC

## 2019-09-21 MED ORDER — ARMC OPHTHALMIC DILATING DROPS
1.0000 "application " | OPHTHALMIC | Status: DC | PRN
  Administered 2019-09-21 (×3): 1 via OPHTHALMIC

## 2019-09-21 MED ORDER — FENTANYL CITRATE (PF) 100 MCG/2ML IJ SOLN
INTRAMUSCULAR | Status: DC | PRN
  Administered 2019-09-21: 100 ug via INTRAVENOUS

## 2019-09-21 MED ORDER — EPINEPHRINE PF 1 MG/ML IJ SOLN
INTRAOCULAR | Status: DC | PRN
  Administered 2019-09-21: 50 mL via OPHTHALMIC

## 2019-09-21 SURGICAL SUPPLY — 30 items
CANNULA ANT/CHMB 27G (MISCELLANEOUS) ×1 IMPLANT
CANNULA ANT/CHMB 27GA (MISCELLANEOUS) ×3 IMPLANT
GLOVE SURG LX 7.5 STRW (GLOVE) ×6
GLOVE SURG LX STRL 7.5 STRW (GLOVE) ×1 IMPLANT
GLOVE SURG TRIUMPH 8.0 PF LTX (GLOVE) ×3 IMPLANT
GOWN STRL REUS W/ TWL LRG LVL3 (GOWN DISPOSABLE) ×2 IMPLANT
GOWN STRL REUS W/TWL LRG LVL3 (GOWN DISPOSABLE) ×9
LENS IOL ACRSF IQ TRC 4 22.0 IMPLANT
LENS IOL ACRYSOF IQ TORIC 22.0 ×3 IMPLANT
LENS IOL IQ TORIC 4 22.0 ×1 IMPLANT
MARKER SKIN DUAL TIP RULER LAB (MISCELLANEOUS) ×3 IMPLANT
NDL CAPSULORHEX 25GA (NEEDLE) ×1 IMPLANT
NDL FILTER BLUNT 18X1 1/2 (NEEDLE) ×2 IMPLANT
NDL RETROBULBAR .5 NSTRL (NEEDLE) IMPLANT
NEEDLE CAPSULORHEX 25GA (NEEDLE) ×3 IMPLANT
NEEDLE FILTER BLUNT 18X 1/2SAF (NEEDLE) ×4
NEEDLE FILTER BLUNT 18X1 1/2 (NEEDLE) ×2 IMPLANT
PACK CATARACT BRASINGTON (MISCELLANEOUS) ×3 IMPLANT
PACK EYE AFTER SURG (MISCELLANEOUS) ×3 IMPLANT
PACK OPTHALMIC (MISCELLANEOUS) ×3 IMPLANT
RING MALYGIN 7.0 (MISCELLANEOUS) IMPLANT
SOLUTION OPHTHALMIC SALT (MISCELLANEOUS) ×3 IMPLANT
SUT ETHILON 10-0 CS-B-6CS-B-6 (SUTURE)
SUT VICRYL  9 0 (SUTURE)
SUT VICRYL 9 0 (SUTURE) IMPLANT
SUTURE EHLN 10-0 CS-B-6CS-B-6 (SUTURE) IMPLANT
SYR 3ML LL SCALE MARK (SYRINGE) ×6 IMPLANT
SYR TB 1ML LUER SLIP (SYRINGE) ×3 IMPLANT
WATER STERILE IRR 250ML POUR (IV SOLUTION) ×3 IMPLANT
WIPE NON LINTING 3.25X3.25 (MISCELLANEOUS) ×3 IMPLANT

## 2019-09-21 NOTE — H&P (Signed)

## 2019-09-21 NOTE — Op Note (Signed)
LOCATION:  [facility]   PREOPERATIVE DIAGNOSIS:  Nuclear sclerotic cataract of the left eye.  H25.12  POSTOPERATIVE DIAGNOSIS:  Nuclear sclerotic cataract of the left eye.   PROCEDURE:  Phacoemulsification with Toric posterior chamber intraocular lens placement of the left eye.  Ultrasound time: Procedure(s) with comments: CATARACT EXTRACTION PHACO AND INTRAOCULAR LENS PLACEMENT (IOC) LEFT TORIC LENS (Left) - 7.22 0:55.0 13.1% LENS:[id number] 22.0 D Toric intraocular lens with 2.25 diopters of cylindrical power with axis orientation at 177 degrees.    SURGEON:  Deirdre Evener, MD   ANESTHESIA:  Topical with tetracaine drops and 2% Xylocaine jelly, augmented with 1% preservative-free intracameral lidocaine.  COMPLICATIONS:  None.   DESCRIPTION OF PROCEDURE:  The patient was identified in the holding room and transported to the operating suite and placed in the supine position under the operating microscope.  The left eye was identified as the operative eye, and it was prepped and draped in the usual sterile ophthalmic fashion.    A clear-corneal paracentesis incision was made at the 1:30 position.  0.5 ml of preservative-free 1% lidocaine was injected into the anterior chamber. The anterior chamber was filled with Viscoat.  A 2.4 millimeter near clear corneal incision was then made at the 10:30 position.  A cystotome and capsulorrhexis forceps were then used to make a curvilinear capsulorrhexis.  Hydrodissection and hydrodelineation were then performed using balanced salt solution.   Phacoemulsification was then used in stop and chop fashion to remove the lens, nucleus and epinucleus.  The remaining cortex was aspirated using the irrigation and aspiration handpiece.  Provisc viscoelastic was then placed into the capsular bag to distend it for lens placement.  The Verion digital marker was used to align the implant at the intended axis.   A 22.0 diopter lens was then injected  into the capsular bag.  It was rotated clockwise until the axis marks on the lens were approximately 15 degrees in the counterclockwise direction to the intended alignment.  The viscoelastic was aspirated from the eye using the irrigation aspiration handpiece.  Then, a Koch spatula through the sideport incision was used to rotate the lens in a clockwise direction until the axis markings of the intraocular lens were lined up with the Verion alignment.  Balanced salt solution was then used to hydrate the wounds. Cefuroxime 0.1 ml of a 10mg /ml solution was injected into the anterior chamber for a dose of 1 mg of intracameral antibiotic at the completion of the case.    The eye was noted to have a physiologic pressure and there was no wound leak noted.   Timolol and Brimonidine drops were applied to the eye.  The patient was taken to the recovery room in stable condition having had no complications of anesthesia or surgery.  Sachit Gilman 09/21/2019, 10:02 AM

## 2019-09-21 NOTE — Anesthesia Preprocedure Evaluation (Signed)
Anesthesia Evaluation  Patient identified by MRN, date of birth, ID band Patient awake    Reviewed: Allergy & Precautions, H&P , NPO status , Patient's Chart, lab work & pertinent test results, reviewed documented beta blocker date and time   Airway Mallampati: II  TM Distance: >3 FB Neck ROM: full    Dental no notable dental hx.    Pulmonary neg pulmonary ROS,    Pulmonary exam normal        Cardiovascular Exercise Tolerance: Good hypertension, Normal cardiovascular exam     Neuro/Psych CVA negative psych ROS   GI/Hepatic negative GI ROS, Neg liver ROS,   Endo/Other  negative endocrine ROS  Renal/GU negative Renal ROS  negative genitourinary   Musculoskeletal negative musculoskeletal ROS (+)   Abdominal Normal abdominal exam  (+)   Peds  Hematology negative hematology ROS (+)   Anesthesia Other Findings   Reproductive/Obstetrics negative OB ROS                             Anesthesia Physical  Anesthesia Plan  ASA: II  Anesthesia Plan: MAC   Post-op Pain Management:    Induction:   PONV Risk Score and Plan: 1 and Ondansetron, Midazolam, TIVA and Treatment may vary due to age or medical condition  Airway Management Planned: Nasal Cannula  Additional Equipment: None  Intra-op Plan:   Post-operative Plan:   Informed Consent: I have reviewed the patients History and Physical, chart, labs and discussed the procedure including the risks, benefits and alternatives for the proposed anesthesia with the patient or authorized representative who has indicated his/her understanding and acceptance.     Dental Advisory Given  Plan Discussed with: CRNA  Anesthesia Plan Comments:         Anesthesia Quick Evaluation

## 2019-09-21 NOTE — Anesthesia Postprocedure Evaluation (Signed)
Anesthesia Post Note  Patient: Bryan Savage  Procedure(s) Performed: CATARACT EXTRACTION PHACO AND INTRAOCULAR LENS PLACEMENT (IOC) LEFT TORIC LENS (Left Eye)     Anesthesia Post Evaluation No complications documented.  Deshara Rossi [employer]

## 2019-09-21 NOTE — Transfer of Care (Signed)
Immediate Anesthesia Transfer of Care Note  Patient: Bryan Savage  Procedure(s) Performed: CATARACT EXTRACTION PHACO AND INTRAOCULAR LENS PLACEMENT (IOC) LEFT TORIC LENS (Left Eye)  Patient Location: PACU  Anesthesia Type: MAC  Level of Consciousness: awake, alert  and patient cooperative  Airway and Oxygen Therapy: Patient Spontanous Breathing and Patient connected to supplemental oxygen  Post-op Assessment: Post-op Vital signs reviewed, Patient's Cardiovascular Status Stable, Respiratory Function Stable, Patent Airway and No signs of Nausea or vomiting  Post-op Vital Signs: Reviewed and stable  Complications: No complications documented.

## 2019-09-21 NOTE — Anesthesia Procedure Notes (Signed)
Procedure Name: MAC Date/Time: 09/21/2019 9:37 AM Performed by: Silvana Newness, CRNA Pre-anesthesia Checklist: Patient identified, Emergency Drugs available, Suction available, Patient being monitored and Timeout performed Patient Re-evaluated:Patient Re-evaluated prior to induction Oxygen Delivery Method: Nasal cannula Placement Confirmation: positive ETCO2

## 2019-09-22 ENCOUNTER — Encounter: Payer: Self-pay | Admitting: Ophthalmology

## 2019-11-23 IMAGING — CT CT ABD-PELV W/ CM
2 of 5 series · 16 of 46 positions shown, 18 images · IV contrast (APPLIED)
Comparison: 08/07/2015

CLINICAL DATA: Abdominal pain, history of bowel obstruction

EXAM:
CT ABDOMEN AND PELVIS WITH CONTRAST
TECHNIQUE: Multidetector CT imaging of the abdomen and pelvis was performed
using the standard protocol following bolus administration of
intravenous contrast.
CONTRAST:  100mL 5XHIOS-044 IOPAMIDOL (5XHIOS-044) INJECTION 61%

[Series 2: routine abd/pel with · axial · 0.91mm/px · z∈[-908,-522]mm · 13 of 87 slices shown, 15 images]
[im 5/87  soft-tissue]
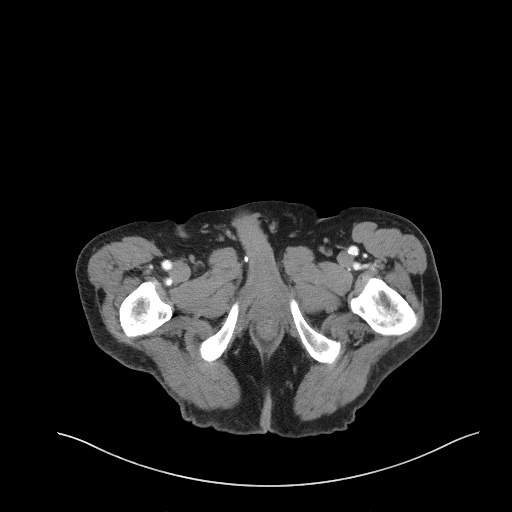
[im 5/87  bone]
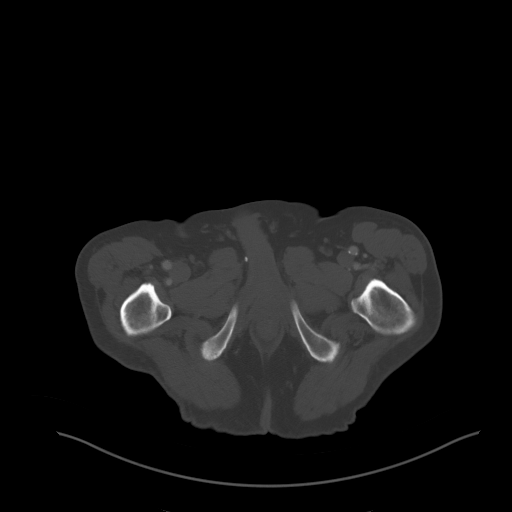
[im 14/87  soft-tissue]
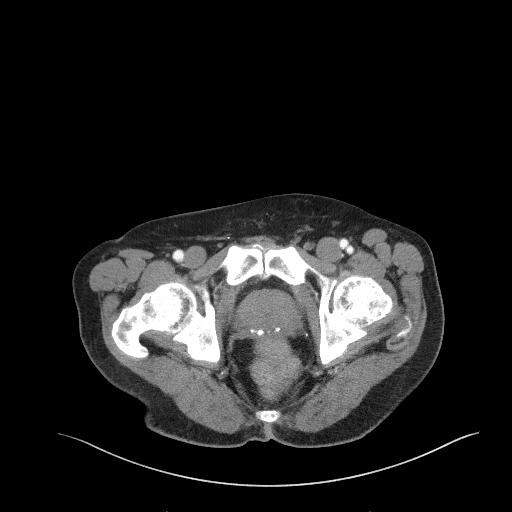
[im 19/87  soft-tissue]
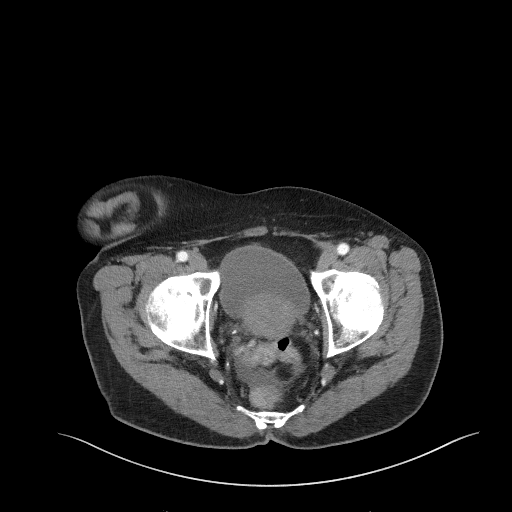
[im 23/87  soft-tissue]
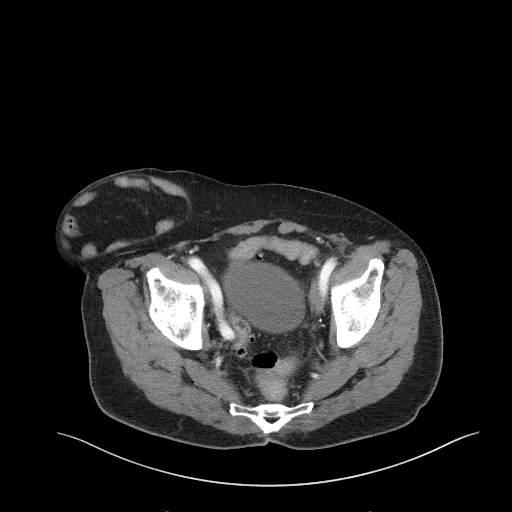
[im 32/87  soft-tissue]
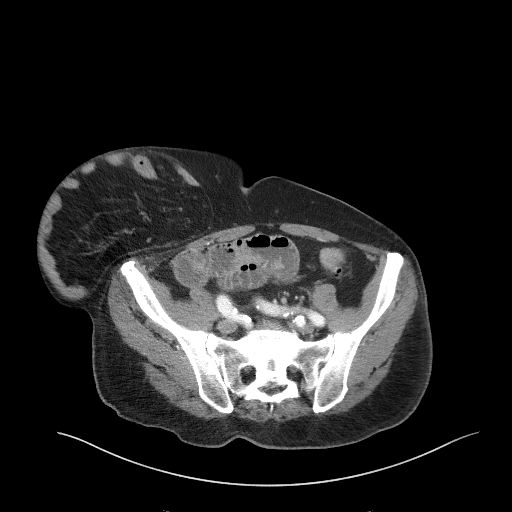
[im 37/87  soft-tissue]
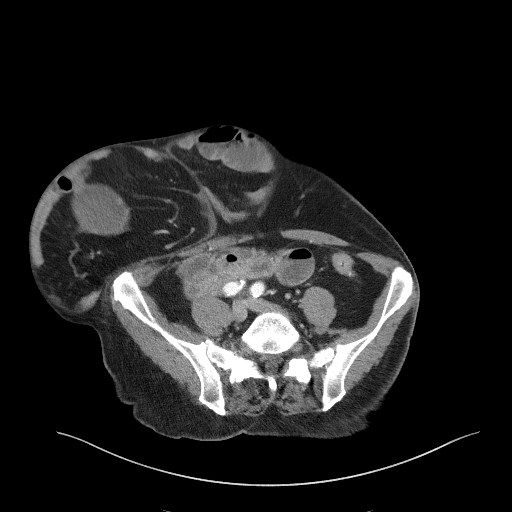
[im 46/87  soft-tissue]
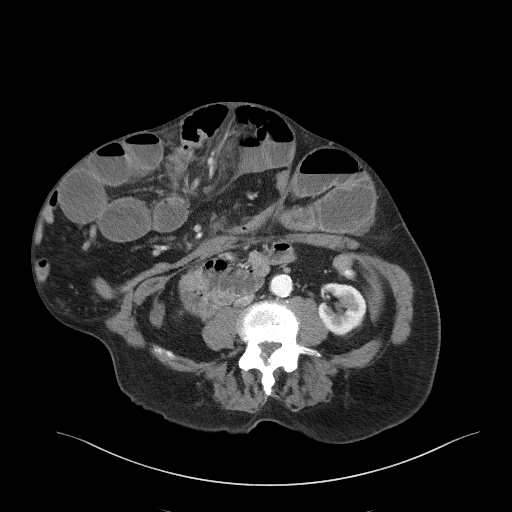
[im 50/87  soft-tissue]
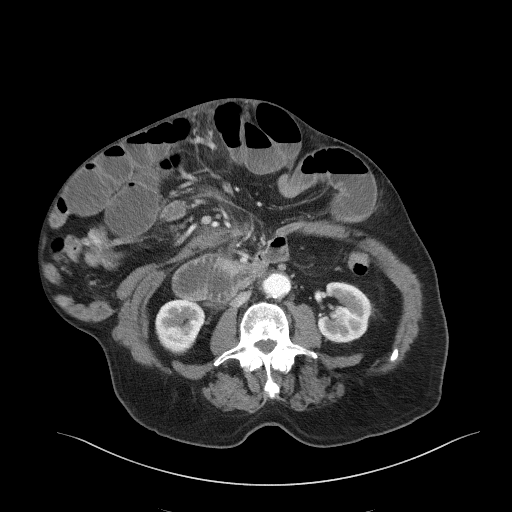
[im 55/87  soft-tissue]
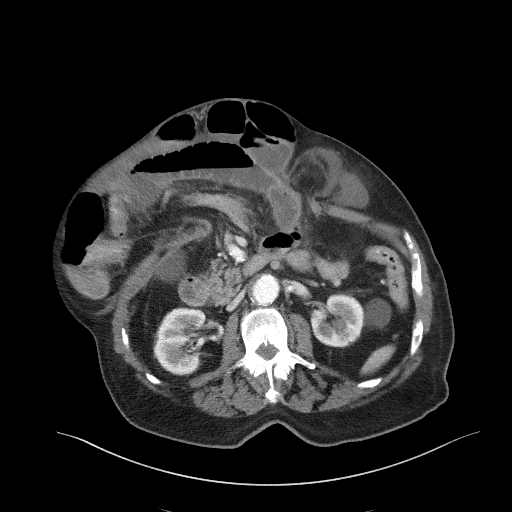
[im 55/87  bone]
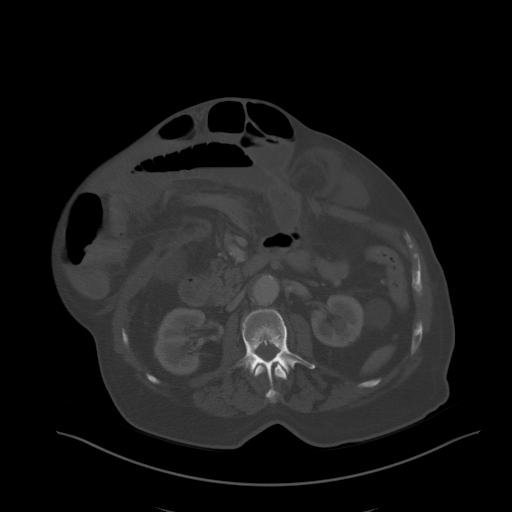
[im 64/87  soft-tissue]
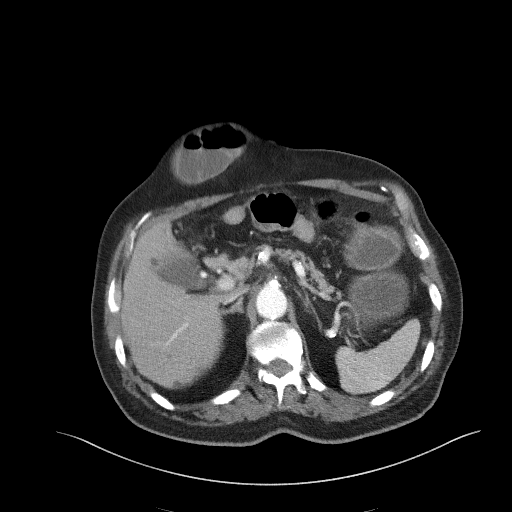
[im 68/87  soft-tissue]
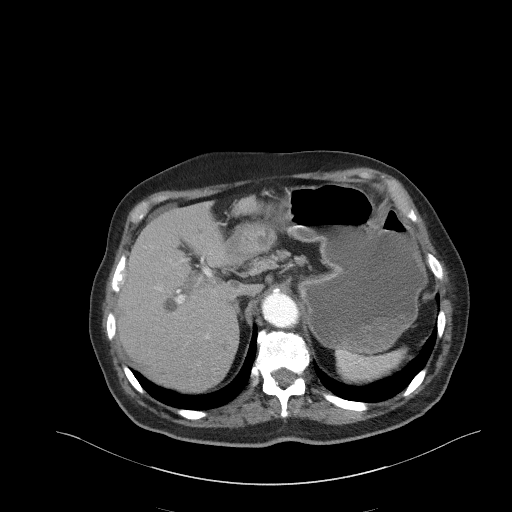
[im 73/87  soft-tissue]
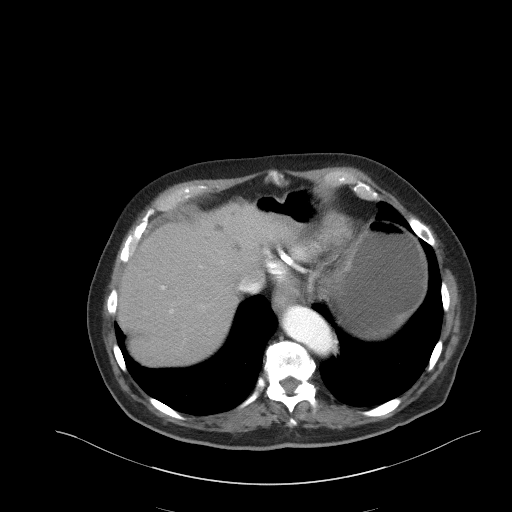
[im 82/87  soft-tissue]
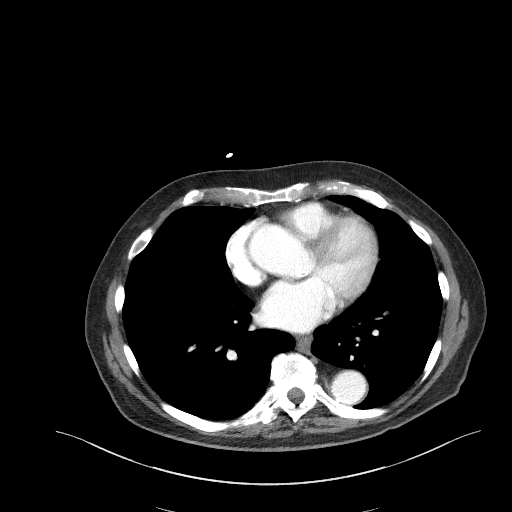

[Series 5: coronal st · coronal · 0.85mm/px · 3 of 107 slices shown]
[im 36/107  soft-tissue]
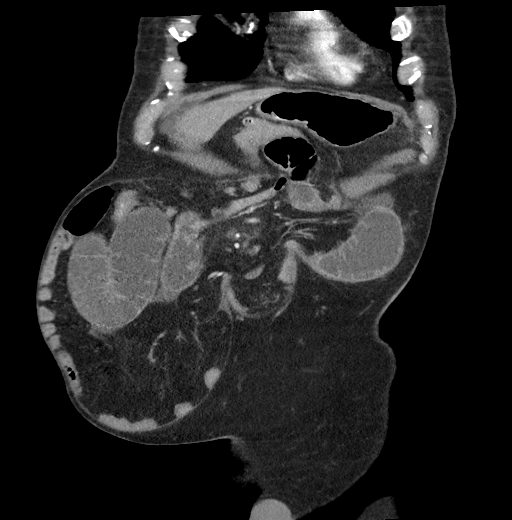
[im 48/107  soft-tissue]
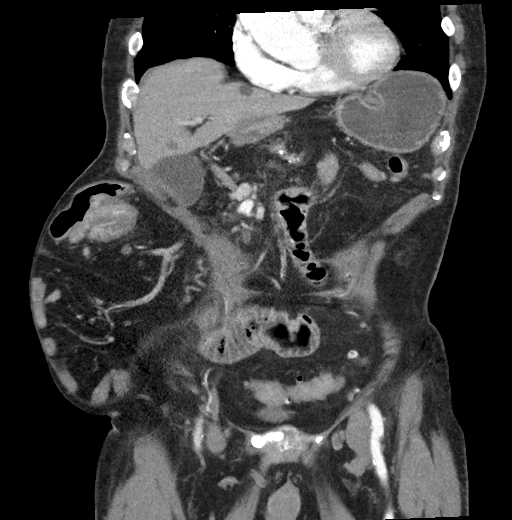
[im 59/107  soft-tissue]
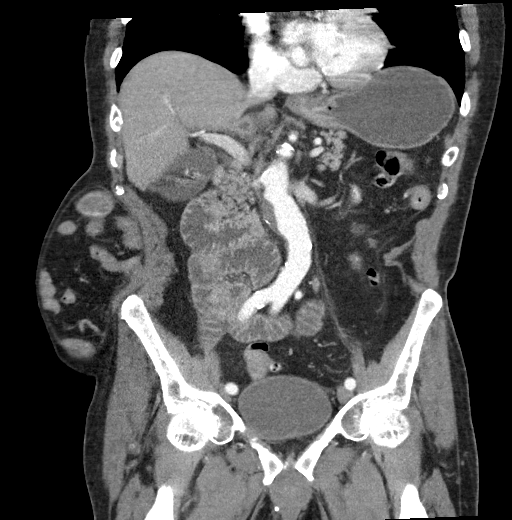

[16 of 46 positions shown; findings below may reference images not displayed]

FINDINGS: Lower chest: Lung bases are essentially clear.

Hepatobiliary: Scattered hepatic cysts, measuring up to 16 mm in
segment 4A.

Layering gallstones, measuring up to 18 mm, without associated
inflammatory changes. No intrahepatic or extrahepatic ductal
dilatation.

Pancreas: Mild parenchymal atrophy.  No ductal dilatation.

Spleen: Within normal limits.

Adrenals/Urinary Tract: Adrenal glands are within normal limits.

Right kidney is within normal limits. Left renal cysts, measuring up
to 3.4 cm in the lateral interpolar region (series 2/image 35). No
hydronephrosis.

Bladder is within normal limits.

Stomach/Bowel: Stomach is within normal limits.

Large complex ventral hernia containing multiple dilated loops of
small bowel (series 302/image 38), similar to the prior.
Decompressed loops of distal ileum within the right lower aspect of
the hernia (series 2/image 86), reflecting transition within the
hernia sac.

No small bowel wall thickening or pneumatosis.

Ascending colon and proximal/mid transverse colon are located within
the hernia. Distal transverse colon, descending colon, and
rectosigmoid colon are within the abdomen. These rema[REDACTED]ompressed. Sigmoid diverticulosis, without evidence of
diverticulitis.

Vascular/Lymphatic: 3.0 x 3.3 cm infrarenal abdominal aortic
aneurysm (series 2/image 36) with eccentric mural thrombus.

Atherosclerotic calcifications of the abdominal aorta and branch
vessels.

No suspicious abdominopelvic lymphadenopathy.

Reproductive: Prostatomegaly.

Other: Trace pelvic ascites.

No free air.

Musculoskeletal: Degenerative changes of the visualized
thoracolumbar spine.
IMPRESSION: Small bowel obstruction with transition involving the ileum, located
within a chronic large complex ventral hernia, as described above.
This appearance is similar to the prior from 0090.

No small bowel wall thickening or pneumatosis to suggest an at risk
loop of bowel. Small volume pelvic ascites. No free air.

Cholelithiasis, without associated inflammatory changes.

3.0 x 3.3 cm infrarenal abdominal aortic aneurysm. Recommend
followup by ultrasound in 3 years. This recommendation follows ACR
consensus guidelines: White Paper of the ACR Incidental Findings
Committee II on Vascular Findings. [HOSPITAL] 4596;

## 2019-11-24 IMAGING — DX DG ABD PORTABLE 2V
2 series · 2 of 2 positions shown · non-contrast
Comparison: Upper abdominal flat plate June 30, 2017

CLINICAL DATA: Chronic ventral hernia.  Small bowel obstruction.

EXAM:
PORTABLE ABDOMEN - 2 VIEW

[abdomen erect]
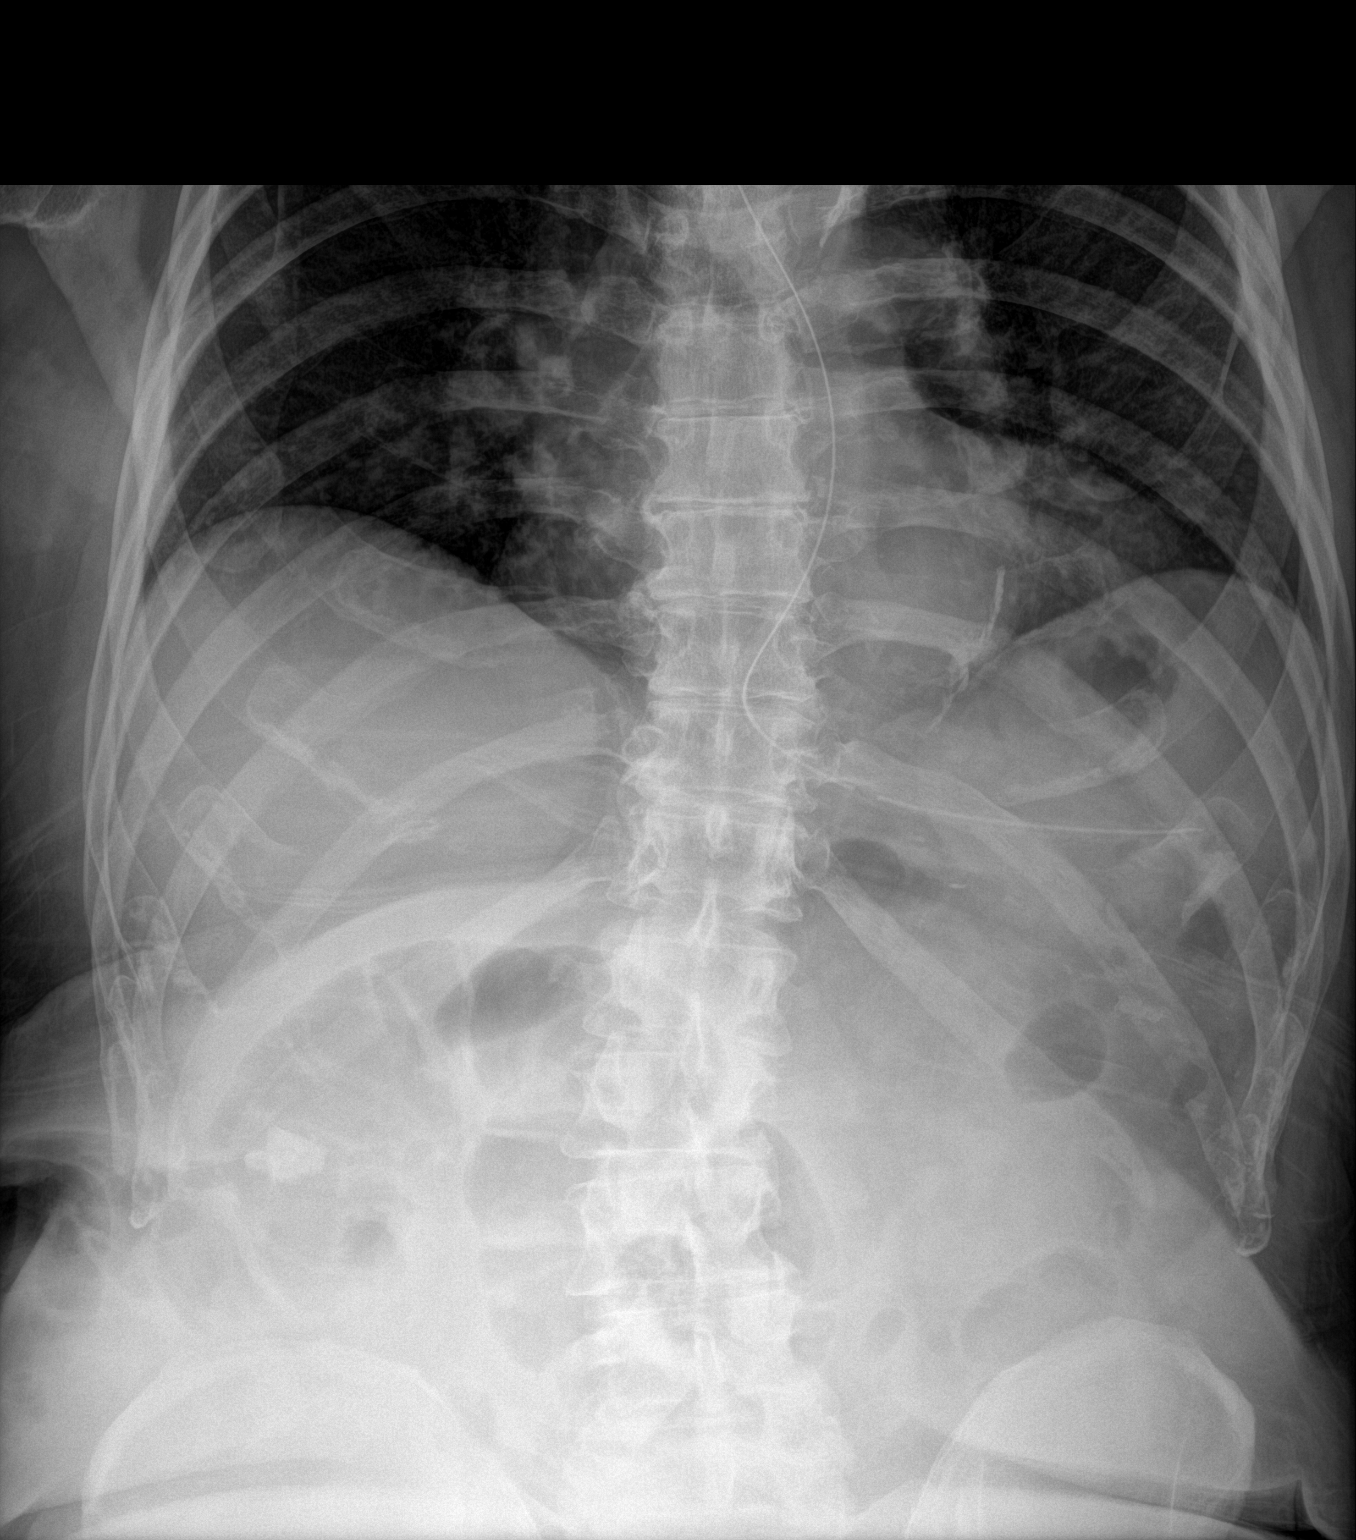

[abdomen supine]
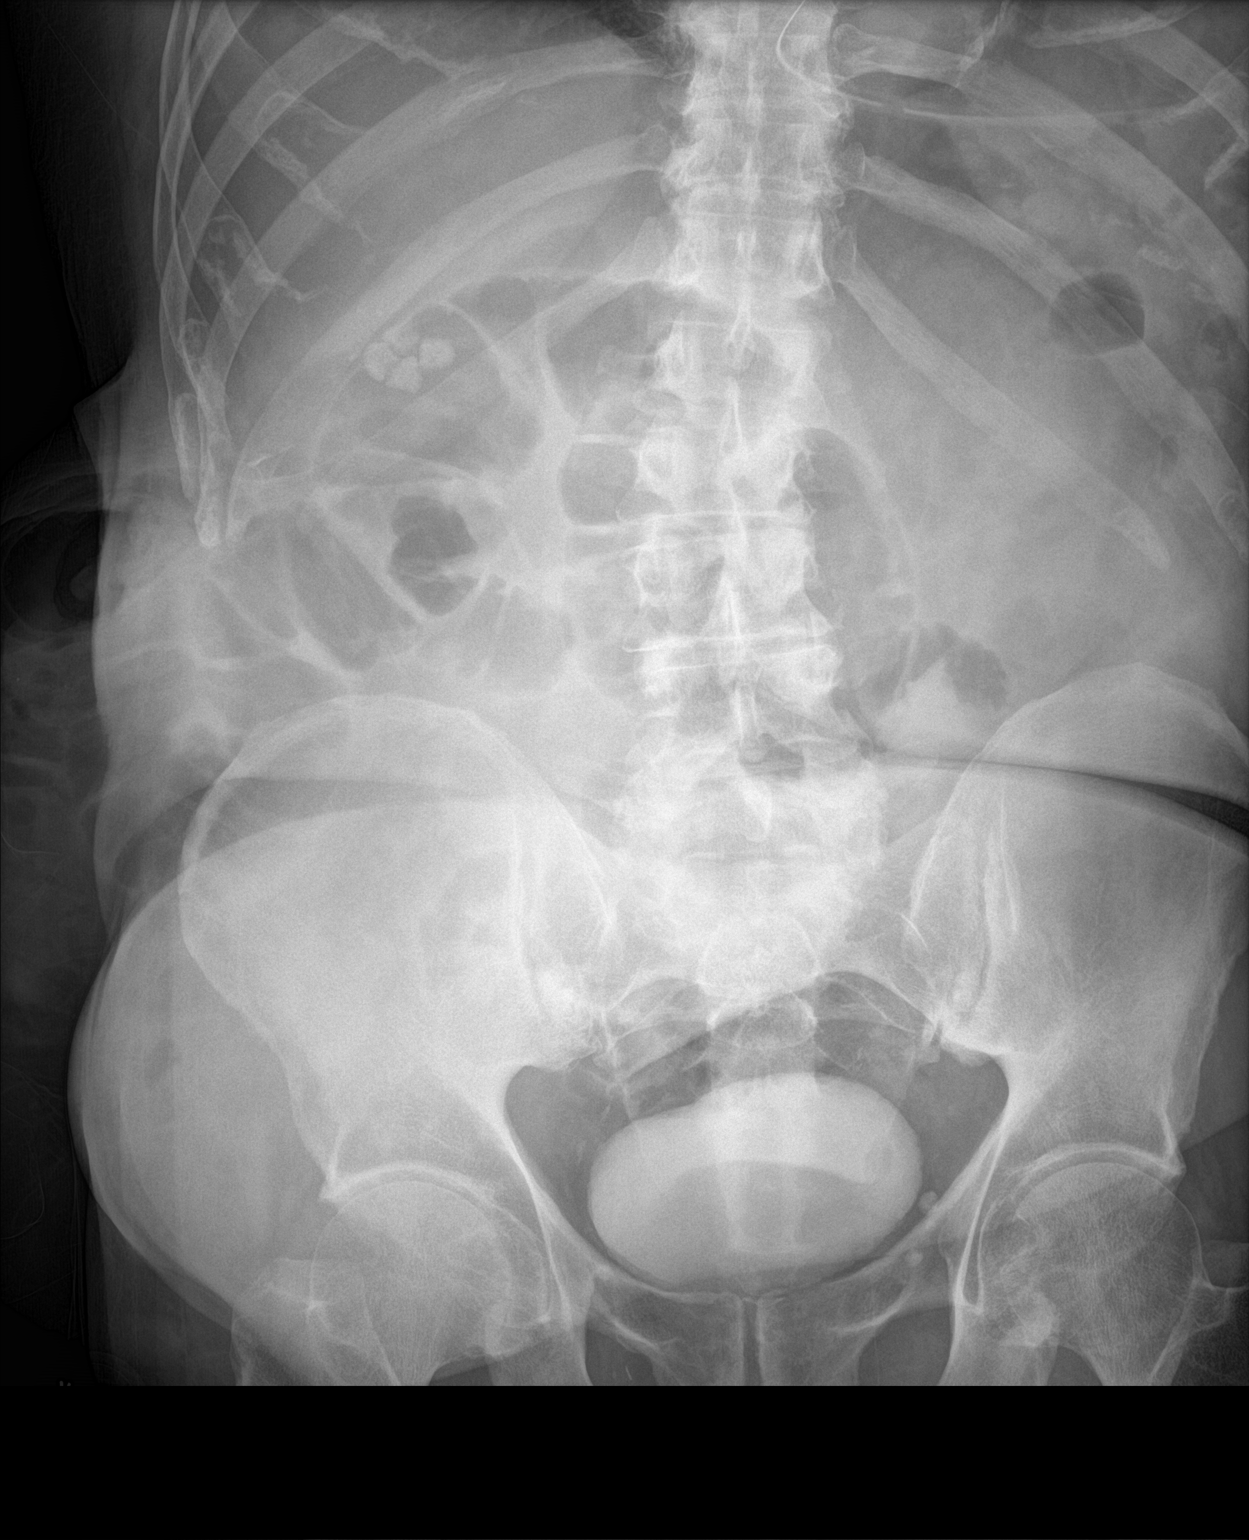

[2 of 2 positions shown; findings below may reference images not displayed]

FINDINGS: There are loops of moderately distended gas-filled small and large
bowel loops which appear to inter a known ventral hernia. No free
extraluminal gas is observed. The esophagogastric tube tip in
proximal port lie in the gastric cardia with the proximal port just
below the GE junction. There are calcified gallstones in the right
upper quadrant. There is contrast within the urinary bladder. There
is moderate degenerative change with gentle dextrocurvature of the
thoracic spine.
IMPRESSION: Moderate gaseous distention of small and large bowel loops in the
midline and to the right which appear to enter a known ventral
hernia consistent with partial obstruction versus ileus. No evidence
of perforation.

Gallstones.

## 2023-08-03 ENCOUNTER — Other Ambulatory Visit: Payer: Self-pay

## 2023-08-03 ENCOUNTER — Ambulatory Visit
Admission: EM | Admit: 2023-08-03 | Discharge: 2023-08-03 | Discharge status: Against Medical Advice | Attending: Emergency Medicine | Admitting: Emergency Medicine

## 2023-08-03 ENCOUNTER — Encounter: Payer: Self-pay | Admitting: Emergency Medicine

## 2023-08-03 DIAGNOSIS — R112 Nausea with vomiting, unspecified: Secondary | ICD-10-CM

## 2023-08-03 DIAGNOSIS — I4891 Unspecified atrial fibrillation: Secondary | ICD-10-CM

## 2023-08-03 DIAGNOSIS — R531 Weakness: Secondary | ICD-10-CM | POA: Diagnosis not present

## 2023-08-03 DIAGNOSIS — I491 Atrial premature depolarization: Secondary | ICD-10-CM | POA: Diagnosis not present

## 2023-08-03 NOTE — ED Notes (Signed)
 Patient is being discharged from the Urgent Care and sent to the Emergency Department via EMS . Per Dr. Van, patient is in need of higher level of care due to new onset a-fib and fatigue. Patient is aware and verbalizes understanding of plan of care.  Vitals:   08/03/23 1743  BP: (!) 148/86  Pulse: (!) 50  Resp: 16  Temp: 97.7 F (36.5 C)  SpO2: 96%

## 2023-08-03 NOTE — ED Triage Notes (Signed)
 Pt presents with vomiting x 6 last night. He woke up today feeling weak.

## 2023-08-03 NOTE — ED Provider Notes (Addendum)
 HPI  SUBJECTIVE:  Bryan Savage is a 83 y.o. male who presents with 6 episodes of nonbilious, nonbloody emesis and nausea starting last night.  He states that this has resolved and is tolerating p.o. now, but now has generalized weakness.  No abdominal pain, distention, fevers.  He had a normal bowel movement this morning.  Denies melena, hematochezia.  No chest pain, shortness of breath, palpitations, lower extremity edema, coughing or wheezing.  No aggravating or alleviating factors.  He has not tried anything for his symptoms. Patient has a past medical history of hypertension, multiple SBO's, TIA, ventral hernia.  No history of MI, atrial fibrillation, or other arrhythmia.  Past Medical History:  Diagnosis Date   Bowel obstruction (HCC)    Hypertension    Stroke (HCC) 04/2017   Minor, no deficits   Ventral hernia    had SBO in the hernia in 2017 and 2019    Past Surgical History:  Procedure Laterality Date   APPENDECTOMY     CATARACT EXTRACTION W/PHACO Right 08/24/2019   Procedure: CATARACT EXTRACTION PHACO AND INTRAOCULAR LENS PLACEMENT (IOC) RIGHT toric lens;  Surgeon: Mittie Gaskin, MD;  Location[facility];  Service: Ophthalmology;  Laterality: Right;  5.79 0:56.9 10.2%   CATARACT EXTRACTION W/PHACO Left 09/21/2019   Procedure: CATARACT EXTRACTION PHACO AND INTRAOCULAR LENS PLACEMENT (IOC) LEFT TORIC LENS;  Surgeon: Mittie Gaskin, MD;  Location: [facility];  Service: Ophthalmology;  Laterality: Left;  7.22 0:55.0 13.1%   HERNIA REPAIR     ventral hernia in [place] 35 yr ago   TONSILLECTOMY      Family History  Problem Relation Age of Onset   Hypertension Father     Social History   Tobacco Use   Smoking status: Never   Smokeless tobacco: Never  Vaping Use   Vaping status: Never Used  Substance Use Topics   Alcohol use: No   Drug use: No    No current facility-administered medications for this encounter.  Current Outpatient  Medications:    Ascorbic Acid (VITAMIN C) 1000 MG tablet, Take 1,000 mg by mouth daily., Disp: , Rfl:    aspirin  EC 81 MG tablet, Take 81 mg by mouth., Disp: , Rfl:    atorvastatin  (LIPITOR) 40 MG tablet, Take 40 mg by mouth daily., Disp: , Rfl:    clopidogrel (PLAVIX) 75 MG tablet, Take 75 mg by mouth daily., Disp: , Rfl:    losartan (COZAAR) 100 MG tablet, Take 100 mg by mouth daily., Disp: , Rfl:    Multiple Vitamins-Minerals (CENTRUM SILVER 50+MEN PO), Take 1 tablet by mouth daily., Disp: , Rfl:    Multiple Vitamins-Minerals (PRESERVISION AREDS 2 PO), Take by mouth 2 (two) times daily., Disp: , Rfl:    OVER THE COUNTER MEDICATION, Dietary Supplements:  Memory Formula, Mineral Formula, Ultimate Colon Care, Focus Factor, Disp: , Rfl:    triamterene -hydrochlorothiazide  (DYAZIDE ) 37.5-25 MG capsule, Take 1 capsule by mouth daily., Disp: , Rfl:   Allergies  Allergen Reactions   Honey     Makes me high   Strawberry (Diagnostic)     Makes me high   Strawberry Extract     Makes me high   Egg-Derived Products Nausea And Vomiting     ROS  As noted in HPI.   Physical Exam  BP (!) 148/86 (BP Location: Left Arm)   Pulse (!) 50 Comment: irregular  Temp 97.7 F (36.5 C) (Oral)   Resp 16   SpO2 96%  Constitutional: Well developed, well nourished, no acute distress.  Pink, well-perfused. Eyes: PERRL, EOMI, conjunctiva normal bilaterally HENT: Normocephalic, atraumatic,mucus membranes moist Respiratory: Clear to auscultation bilaterally, no rales, no wheezing, no rhonchi Cardiovascular: Irregularly irregular, no murmurs, no gallops, no rubs GI: Large ventral hernia, soft, nondistended, normal bowel sounds, nontender, no rebound, no guarding skin: No rash, skin intact Musculoskeletal: no deformities Neurologic: Alert & oriented x 3, CN III-XII grossly intact, no motor deficits, sensation grossly intact Psychiatric: Speech and behavior appropriate   ED  Course   Medications - No data to display  Orders Placed This Encounter  Procedures   EKG 12-Lead    Standing Status:   Standing    Number of Occurrences:   1   EKG 12-Lead    Standing Status:   Standing    Number of Occurrences:   1   No results found for this or any previous visit (from the past 24 hours). No results found.  ED Clinical Impression  1. Atrial fibrillation, new onset (HCC)   2. Weakness   3. Nausea and vomiting, unspecified vomiting type      ED Assessment/Plan     EKG: #1  Irregularly irregular atrial fibrillation, rate 92.  Left axis deviation.  No hypertrophy.  No ST T wave changes. #2 Irregularly irregular atrial fibrillation, rate 94.  Left axis deviation.  No hypertrophy.  No ST T wave changes.  Patient is in new onset atrial fibrillation.  This could be due to electrolyte imbalance, acute renal failure, or number of emergent causes.  While he appears stable here with no ischemic cardiac changes on EKG, I discussed with him and family member that he needs to go immediately to the emergency department by EMS for cardiac monitoring on route.  Discussed with him that I am not sure what could happen during transit, but an MI or stroke could occur.  Patient adamantly wants to go to the [facility] emergency department as all of his medical records are there.  Discussed with him multiple times that I would like for him to go by EMS, as I do not think that he is safe to go by private vehicle, but he has opted to go to [place] by private vehicle.  Signing him out AMA.  Gave report to [facility] emergency department charge nurse.  No orders of the defined types were placed in this encounter.     *This clinic note was created using Dragon dictation software. Therefore, there may be occasional mistakes despite careful proofreading. ?    Van Knee, MD 08/03/23 8186    Van Knee, MD 08/03/23 [phone]

## 2023-08-03 NOTE — Discharge Instructions (Signed)
 Your EKG shows that you are in new onset atrial fibrillation.  This could be caused by a number of things.  I would prefer you to go via EMS because atrial fibrillation can predispose to this strokes, cardiac ischemia, or other emergent condition, but I understand where you are coming from.  Go immediately to the [facility] emergency department.  Let them know if your weakness gets worse, you start having chest pain, shortness of breath, or for any concerns.

## 2023-08-03 NOTE — ED Notes (Signed)
 Pt refused travel by EMS he wants to go to [facility]. His grandson agreed to take him. AMA form signed.
